# Patient Record
Sex: Female | Born: 1960
Health system: Southern US, Community
[De-identification: ages and names within clinical notes are randomized; demographics above are authoritative.]

## PROBLEM LIST (undated history)

## (undated) DIAGNOSIS — F419 Anxiety disorder, unspecified: Secondary | ICD-10-CM

## (undated) DIAGNOSIS — J45909 Unspecified asthma, uncomplicated: Secondary | ICD-10-CM

## (undated) DIAGNOSIS — I499 Cardiac arrhythmia, unspecified: Secondary | ICD-10-CM

## (undated) DIAGNOSIS — N3010 Interstitial cystitis (chronic) without hematuria: Secondary | ICD-10-CM

## (undated) DIAGNOSIS — J309 Allergic rhinitis, unspecified: Secondary | ICD-10-CM

## (undated) DIAGNOSIS — F329 Major depressive disorder, single episode, unspecified: Secondary | ICD-10-CM

## (undated) DIAGNOSIS — F32A Depression, unspecified: Secondary | ICD-10-CM

## (undated) DIAGNOSIS — M722 Plantar fascial fibromatosis: Secondary | ICD-10-CM

## (undated) DIAGNOSIS — G4700 Insomnia, unspecified: Secondary | ICD-10-CM

## (undated) DIAGNOSIS — R232 Flushing: Secondary | ICD-10-CM

## (undated) DIAGNOSIS — Z8742 Personal history of other diseases of the female genital tract: Secondary | ICD-10-CM

## (undated) DIAGNOSIS — K5190 Ulcerative colitis, unspecified, without complications: Secondary | ICD-10-CM

## (undated) HISTORY — DX: Unspecified asthma, uncomplicated: J45.909

## (undated) HISTORY — DX: Insomnia, unspecified: G47.00

## (undated) HISTORY — PX: RHINOPLASTY: SUR1284

## (undated) HISTORY — DX: Major depressive disorder, single episode, unspecified: F32.9

## (undated) HISTORY — DX: Anxiety disorder, unspecified: F41.9

## (undated) HISTORY — DX: Depression, unspecified: F32.A

## (undated) HISTORY — DX: Cardiac arrhythmia, unspecified: I49.9

## (undated) HISTORY — DX: Flushing: R23.2

## (undated) HISTORY — DX: Interstitial cystitis (chronic) without hematuria: N30.10

## (undated) HISTORY — DX: Plantar fascial fibromatosis: M72.2

## (undated) HISTORY — DX: Allergic rhinitis, unspecified: J30.9

## (undated) HISTORY — DX: Personal history of other diseases of the female genital tract: Z87.42

## (undated) HISTORY — DX: Ulcerative colitis, unspecified, without complications: K51.90

## (undated) HISTORY — PX: DILATION AND CURETTAGE OF UTERUS: SHX78

## (undated) HISTORY — PX: OTHER SURGICAL HISTORY: SHX169

---

## 2014-08-04 DIAGNOSIS — J45909 Unspecified asthma, uncomplicated: Secondary | ICD-10-CM | POA: Insufficient documentation

## 2014-08-23 ENCOUNTER — Other Ambulatory Visit: Payer: Self-pay | Admitting: Gastroenterology

## 2014-12-17 ENCOUNTER — Encounter: Payer: Self-pay | Admitting: Family Medicine

## 2014-12-17 ENCOUNTER — Ambulatory Visit (INDEPENDENT_AMBULATORY_CARE_PROVIDER_SITE_OTHER): Payer: BLUE CROSS/BLUE SHIELD | Admitting: Family Medicine

## 2014-12-17 VITALS — BP 112/80 | HR 84 | Temp 98.1°F | Ht 67.25 in | Wt 145.7 lb

## 2014-12-17 DIAGNOSIS — K51919 Ulcerative colitis, unspecified with unspecified complications: Secondary | ICD-10-CM

## 2014-12-17 DIAGNOSIS — Z7689 Persons encountering health services in other specified circumstances: Secondary | ICD-10-CM

## 2014-12-17 DIAGNOSIS — Z13220 Encounter for screening for lipoid disorders: Secondary | ICD-10-CM

## 2014-12-17 DIAGNOSIS — N951 Menopausal and female climacteric states: Secondary | ICD-10-CM

## 2014-12-17 DIAGNOSIS — Z8742 Personal history of other diseases of the female genital tract: Secondary | ICD-10-CM

## 2014-12-17 DIAGNOSIS — F32A Depression, unspecified: Secondary | ICD-10-CM

## 2014-12-17 DIAGNOSIS — F329 Major depressive disorder, single episode, unspecified: Secondary | ICD-10-CM

## 2014-12-17 DIAGNOSIS — F418 Other specified anxiety disorders: Secondary | ICD-10-CM

## 2014-12-17 DIAGNOSIS — F419 Anxiety disorder, unspecified: Secondary | ICD-10-CM

## 2014-12-17 DIAGNOSIS — Z131 Encounter for screening for diabetes mellitus: Secondary | ICD-10-CM

## 2014-12-17 DIAGNOSIS — Z7189 Other specified counseling: Secondary | ICD-10-CM

## 2014-12-17 DIAGNOSIS — R232 Flushing: Secondary | ICD-10-CM

## 2014-12-17 DIAGNOSIS — K5190 Ulcerative colitis, unspecified, without complications: Secondary | ICD-10-CM | POA: Insufficient documentation

## 2014-12-17 DIAGNOSIS — G4700 Insomnia, unspecified: Secondary | ICD-10-CM

## 2014-12-17 HISTORY — DX: Insomnia, unspecified: G47.00

## 2014-12-17 HISTORY — DX: Ulcerative colitis, unspecified, without complications: K51.90

## 2014-12-17 HISTORY — DX: Personal history of other diseases of the female genital tract: Z87.42

## 2014-12-17 HISTORY — DX: Depression, unspecified: F32.A

## 2014-12-17 HISTORY — DX: Flushing: R23.2

## 2014-12-17 HISTORY — DX: Anxiety disorder, unspecified: F41.9

## 2014-12-17 LAB — LIPID PANEL
Cholesterol: 211 mg/dL — ABNORMAL HIGH (ref 0–200)
HDL: 38.7 mg/dL — ABNORMAL LOW (ref >39.00)
LDL Cholesterol: 153 mg/dL — ABNORMAL HIGH (ref 0–99)
NONHDL: 172.3
Total CHOL/HDL Ratio: 5
Triglycerides: 98 mg/dL (ref 0.0–149.0)
VLDL: 19.6 mg/dL (ref 0.0–40.0)

## 2014-12-17 LAB — HEMOGLOBIN A1C: HEMOGLOBIN A1C: 6 % (ref 4.6–6.5)

## 2014-12-17 NOTE — Progress Notes (Signed)
HPI:  Suzanne Marshall is here to establish care. Used to see Dr. Collins [place]ar [place]. Whole family is transferring to me. Last PCP and physical: Sees Dr. Arelia SneddonMcComb for paps, HRT, breast and female exam and vaginal bleeding.  Has the following chronic problems that require follow up and concerns today:  Perimenopausal Vag bleeding/Hot flashes: -sees Dr. Arelia SneddonMcComb for this -had emb and US -takes divigel and prometrium  Depression/Anxiety/Insomnia: -started 5 years ago after step son committed suicide -on lexapro, not getting counseling, did in the past -spiritual, christian -GAD symptoms more then depression now -no sig issues with sleep in a long time -takes 1/2 [place] once per week for sleep -trying to get regular exercise -has tried melatonin for sleep  URI: -for 1.5 weeks -nasal congestion, PND, cough -denies: sinus pain, SOB, NVD, fevers -taking sudafed  Ulcerative colitis: -Dr. Laural [facility]esJohnson [facility] -not on medicaitons for this anymore -has been fairly stable for a while without medications -getting colonoscopy soon  ROS negative for unless reported above: fevers, unintentional weight loss, hearing or vision loss, chest pain, palpitations, struggling to breath, hemoptysis, melena, hematochezia, hematuria, falls, loc, si, thoughts of self harm  Past Medical History  Diagnosis Date  . Depression   . Asthma   . Arrhythmia   . Insomnia 12/17/2014  . History of irregular menstrual bleeding 12/17/2014    S/p eval with gyn, perimenopausal   . Anxiety and depression 12/17/2014  . Hot flashes 12/17/2014  . Ulcerative colitis 12/17/2014    Managed by Dr. Laural BenesJohnson     Past Surgical History  Procedure Laterality Date  . Rhinoplasty      Family History  Problem Relation Age of Onset  . Heart disease Father [id number]    MI  . Hypertension Father   . Heart disease Maternal Grandmother     History   Social History  . Marital Status: Married    Spouse Name: N/A    Number  of Children: N/A  . Years of Education: N/A   Social History Main Topics  . Smoking status: Former Games developermoker  . Smokeless tobacco: None     Comment: smoke a very little bit in highschool  . Alcohol Use: 0.0 oz/week    0 Not specified per week     Comment: rare, light  . Drug Use: No  . Sexual Activity: None   Other Topics Concern  . 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 , Disp: , Rfl: 11;  escitalopram (LEXAPRO) 20 MG tablet, Take 20 mg by mouth every morning. , Disp: , Rfl: 3;  Multiple Vitamin (MULTIVITAMIN WITH MINERALS) TABS tablet, Take 1 tablet by mouth daily., Disp: , Rfl: ;  progesterone (PROMETRIUM) 100 MG capsule, Take 100 mg by mouth at bedtime., Disp: , Rfl:  acetaminophen (TYLENOL) 500 MG tablet, Take 500-1,000 mg by mouth every 6 (six) hours as needed for mild pain or moderate pain., Disp: , Rfl: ;  cetirizine (ZYRTEC) 10 MG tablet, Take 5 mg by mouth at bedtime., Disp: , Rfl: ;  montelukast (SINGULAIR) 10 MG tablet, Take 10 mg by  mouth at bedtime. , Disp: , Rfl: 3 pseudoephedrine-acetaminophen (TYLENOL SINUS) 30-500 MG TABS, Take 1 tablet by mouth every 4 (four) hours as needed (FOR COLD)., Disp: , Rfl: ;  zolpidem (AMBIEN) 10 MG tablet, Take 10 mg by mouth at bedtime as needed for sleep. , Disp: , Rfl: 4  EXAM:  Filed Vitals:   12/17/14 0945  BP: 112/80  Pulse: 84  Temp: 98.1 F (36.7 C)    Body mass index is 22.65 kg/(m^2).  GENERAL: vitals reviewed and listed above, alert, oriented, appears well hydrated and in no acute distress  HEENT: atraumatic, conjunttiva clear, no obvious abnormalities on inspection of external nose and  ears  NECK: no obvious masses on inspection  LUNGS: clear to auscultation bilaterally, no wheezes, rales or rhonchi, good air movement  CV: HRRR, no peripheral edema  MS: moves all extremities without noticeable abnormality  PSYCH: pleasant and cooperative, no obvious depression or anxiety  ASSESSMENT AND PLAN:  Discussed the following assessment and plan:  Hot flashes  Anxiety and depression -cont current tx  Insomnia -advised will rx limited 20 per year ambien and advised 1/2 dose ( ) only for severe sleep issues and advised of risks. Advised other options for sleep  History of irregular menstrual bleeding  Encounter to establish care  Screening for diabetes mellitus - Plan: Hemoglobin A1c  Screening cholesterol level - Plan: Lipid Panel  -We reviewed the PMH, PSH, FH, SH, Meds and Allergies. -We provided refills for any medications we will prescribe as needed. -We addressed current concerns per orders and patient instructions. -We have asked for records for pertinent exams, studies, vaccines and notes from previous providers. -We have advised patient to follow up per instructions below.   -Patient advised to return or notify a doctor immediately if symptoms worsen or persist or new concerns arise.  Patient Instructions  BEFORE YOU LEAVE: -labs -follow up in 3-4 months  -We have ordered labs or studies at this visit. It can take up to 1-2 weeks for results and processing. We will contact you with instructions IF your results are abnormal. Normal results will be released to your [facility]. If you have not heard from [place] or can not find your results in [facility] in 2 weeks please contact our office.  -PLEASE SIGN UP FOR MYCHART TODAY   We recommend the following healthy lifestyle measures: - eat a healthy diet consisting of lots of vegetables, fruits, beans, nuts, seeds, healthy meats such as white chicken and fish and whole grains.  - avoid fried foods, fast food,  processed foods, sodas, red meet and other fattening foods.  - get a least 150 minutes of aerobic exercise per week.   FOR IMPROVED SLEEP AND TO RESET YOUR SLEEP SCHEDULE:  exercise 30 minutes daily   go to bed and wake up at the same time   keep bedroom cool, dark and quiet   reserve bed for sleep - do not read, watch TV, etc in bed   If you toss and turn more then 15-20 minutes get out of bed and list thoughts/do quite activity then go back to bed; repeat as needed; do not worry about when you eventually fall asleep - still get up at the same time and turn on lights and take shower  get counseling   some people find that a half dose of benadryl, melatonin, tylenol pm or unisom on a few nights per week is helpful initially for a few weeks  seek help and treat  any depression or anxiety  prescription strength sleep medications should only be used in severe cases of insomnia if other measures fail and should be used sparingly      Dayanis Bergquist, Dahlia Client R.

## 2014-12-17 NOTE — Patient Instructions (Addendum)
BEFORE YOU LEAVE: -labs -follow up in 3-4 months  -We have ordered labs or studies at this visit. It can take up to 1-2 weeks for results and processing. We will contact you with instructions IF your results are abnormal. Normal results will be released to your [facility]. If you have not heard from us or can not find your results in [facility] in 2 weeks please contact our office.  -PLEASE SIGN UP FOR MYCHART TODAY   We recommend the following healthy lifestyle measures: - eat a healthy diet consisting of lots of vegetables, fruits, beans, nuts, seeds, healthy meats such as white chicken and fish and whole grains.  - avoid fried foods, fast food, processed foods, sodas, red meet and other fattening foods.  - get a least 150 minutes of aerobic exercise per week.   FOR IMPROVED SLEEP AND TO RESET YOUR SLEEP SCHEDULE: []  exercise 30 minutes daily  []  go to bed and wake up at the same time  []  keep bedroom cool, dark and quiet  []  reserve bed for sleep - do not read, watch TV, etc in bed  []  If you toss and turn more then 15-20 minutes get out of bed and list thoughts/do quite activity then go back to bed; repeat as needed; do not worry about when you eventually fall asleep - still get up at the same time and turn on lights and take shower  [] get counseling  []  some people find that a half dose of benadryl, melatonin, tylenol pm or unisom on a few nights per week is helpful initially for a few weeks  [] seek help and treat any depression or anxiety  [] prescription strength sleep medications should only be used in severe cases of insomnia if other measures fail and should be used sparingly

## 2014-12-17 NOTE — Progress Notes (Signed)
Pre visit review using our clinic review tool, if applicable. No additional management support is needed unless otherwise documented below in the visit note. 

## 2014-12-20 ENCOUNTER — Other Ambulatory Visit: Payer: Self-pay | Admitting: Gastroenterology

## 2014-12-20 ENCOUNTER — Encounter (HOSPITAL_COMMUNITY): Payer: Self-pay | Admitting: *Deleted

## 2014-12-28 ENCOUNTER — Ambulatory Visit (HOSPITAL_COMMUNITY): Payer: BLUE CROSS/BLUE SHIELD | Admitting: Anesthesiology

## 2014-12-28 ENCOUNTER — Encounter (HOSPITAL_COMMUNITY)
Admission: RE | Disposition: A | Discharge status: Home / Self Care | Payer: Self-pay | Source: Ambulatory Visit | Attending: Gastroenterology

## 2014-12-28 ENCOUNTER — Encounter (HOSPITAL_COMMUNITY): Payer: Self-pay

## 2014-12-28 ENCOUNTER — Ambulatory Visit (HOSPITAL_COMMUNITY)
Admission: RE | Admit: 2014-12-28 | Discharge: 2014-12-28 | Disposition: A | Discharge status: Home / Self Care | Payer: BLUE CROSS/BLUE SHIELD | Source: Ambulatory Visit | Attending: Gastroenterology | Admitting: Gastroenterology

## 2014-12-28 DIAGNOSIS — J45909 Unspecified asthma, uncomplicated: Secondary | ICD-10-CM | POA: Diagnosis not present

## 2014-12-28 DIAGNOSIS — F329 Major depressive disorder, single episode, unspecified: Secondary | ICD-10-CM | POA: Insufficient documentation

## 2014-12-28 DIAGNOSIS — Z87891 Personal history of nicotine dependence: Secondary | ICD-10-CM | POA: Diagnosis not present

## 2014-12-28 DIAGNOSIS — Z09 Encounter for follow-up examination after completed treatment for conditions other than malignant neoplasm: Secondary | ICD-10-CM | POA: Diagnosis not present

## 2014-12-28 DIAGNOSIS — Z8711 Personal history of peptic ulcer disease: Secondary | ICD-10-CM | POA: Diagnosis not present

## 2014-12-28 HISTORY — PX: COLONOSCOPY WITH PROPOFOL: SHX5780

## 2014-12-28 SURGERY — COLONOSCOPY WITH PROPOFOL
Anesthesia: Monitor Anesthesia Care

## 2014-12-28 MED ORDER — PROPOFOL 10 MG/ML IV EMUL
INTRAVENOUS | Status: DC | PRN
  Administered 2014-12-28: 20 mg via INTRAVENOUS
  Administered 2014-12-28: 40 mg via INTRAVENOUS

## 2014-12-28 MED ORDER — PROPOFOL INFUSION 10 MG/ML OPTIME
INTRAVENOUS | Status: DC | PRN
  Administered 2014-12-28: 100 ug/kg/min via INTRAVENOUS

## 2014-12-28 MED ORDER — PROPOFOL 10 MG/ML IV BOLUS
INTRAVENOUS | Status: AC
  Filled 2014-12-28: qty 20

## 2014-12-28 MED ORDER — LACTATED RINGERS IV SOLN
INTRAVENOUS | Status: DC | PRN
  Administered 2014-12-28: 07:00:00 via INTRAVENOUS

## 2014-12-28 MED ORDER — SODIUM CHLORIDE 0.9 % IV SOLN: INTRAVENOUS | Status: DC

## 2014-12-28 MED ORDER — LACTATED RINGERS IV SOLN: INTRAVENOUS | Status: DC

## 2014-12-28 MED ORDER — LIDOCAINE HCL (CARDIAC) 20 MG/ML IV SOLN
INTRAVENOUS | Status: AC
  Filled 2014-12-28: qty 5

## 2014-12-28 SURGICAL SUPPLY — 22 items

## 2014-12-28 NOTE — H&P (Signed)
  Procedure: Surveillance colonoscopy. Universal ulcerative colitis since age 54  History: The patient is a 54 year old female born 07-15-1961. She was diagnosed with ulcerative colitis at age 89. She has never required surgery. Symptomatically, the patient's ulcerative colitis is in remission. She denies bloody diarrhea. She is scheduled to undergo a surveillance colonoscopy today.  Medication allergies: Sulfa  Past medical history: Chronic ulcerative colitis diagnosed at age 54. Allergic asthma. Interstitial cystitis.  Family history: Negative for colon cancer.  Exam: The patient is alert and lying comfortably on the endoscopy stretcher. Abdomen is soft and nontender to palpation. Lungs are clear to auscultation. Cardiac exam reveals a regular rhythm.  Plan: Proceed with surveillance colonoscopy

## 2014-12-28 NOTE — Op Note (Signed)
Procedure: Surveillance colonoscopy. Ulcerative colitis diagnosed at age 54.  Endoscopist: Danise EdgeMartin Johnson  Premedication: Propofol administered by anesthesia  Procedure: The patient was placed in the left lateral decubitus position. Anal inspection and digital rectal exam were normal. The Pentax pediatric colonoscope was introduced into the rectum and advanced to the cecum. A normal-appearing ileocecal valve was identified. A normal-appearing appendiceal orifice was identified. Colonic preparation for the exam today was good. Withdrawal time was 22 minutes  Rectum. Normal.  Sigmoid colon and descending colon. Normal  Splenic flexure. Normal  Transverse colon. Normal  Hepatic flexure. Normal  Ascending colon. Normal  Cecum and ileocecal valve. Normal  Surveillance mucosal biopsies. 8 random biopsies were performed from the right colon, 8 random biopsies were performed from the transverse colon, 8 random biopsies were performed from the descending colon, 8 random biopsies were performed from the rectosigmoid:  Assessment: Normal colonoscopy. No mucosal scarring, ulceration, or signs of chronic ulcerative colitis.

## 2014-12-28 NOTE — Anesthesia Preprocedure Evaluation (Addendum)
Anesthesia Evaluation  Patient identified by MRN, date of birth, ID band Patient awake    Reviewed: Allergy & Precautions, NPO status , Patient's Chart, lab work & pertinent test results  History of Anesthesia Complications Negative for: history of anesthetic complications  Airway Mallampati: I  TM Distance: >3 FB Neck ROM: Full    Dental  (+) Teeth Intact, Dental Advisory Given   Pulmonary asthma , former smoker,    Pulmonary exam normal       Cardiovascular negative cardio ROS      Neuro/Psych PSYCHIATRIC DISORDERS Depression negative neurological ROS     GI/Hepatic Neg liver ROS, PUD,   Endo/Other  negative endocrine ROS  Renal/GU negative Renal ROS     Musculoskeletal   Abdominal   Peds  Hematology   Anesthesia Other Findings   Reproductive/Obstetrics                            Anesthesia Physical Anesthesia Plan  ASA: II  Anesthesia Plan: MAC   Post-op Pain Management:    Induction: Intravenous  Airway Management Planned: Simple Face Mask  Additional Equipment:   Intra-op Plan:   Post-operative Plan:   Informed Consent: I have reviewed the patients History and Physical, chart, labs and discussed the procedure including the risks, benefits and alternatives for the proposed anesthesia with the patient or authorized representative who has indicated his/her understanding and acceptance.   Dental advisory given  Plan Discussed with:   Anesthesia Plan Comments:         Anesthesia Quick Evaluation

## 2014-12-28 NOTE — Anesthesia Postprocedure Evaluation (Signed)
Anesthesia Post Note  Patient: Suzanne Marshall  Procedure(s) Performed: Procedure(s) (LRB): COLONOSCOPY WITH PROPOFOL (N/A)  Anesthesia type: MAC  Patient location: PACU  Post pain: Pain level controlled  Post assessment: Patient's Cardiovascular Status Stable  Last Vitals:  Filed Vitals:   12/28/14 0830  BP:   Pulse: 70  Temp: 36.6 C  Resp: 15    Post vital signs: Reviewed and stable  Level of consciousness: sedated  Complications: No apparent anesthesia complications

## 2014-12-28 NOTE — Transfer of Care (Signed)
Immediate Anesthesia Transfer of Care Note  Patient: Suzanne Marshall  Procedure(s) Performed: Procedure(s): COLONOSCOPY WITH PROPOFOL (N/A)  Patient Location: PACU  Anesthesia Type:MAC  Level of Consciousness: awake, alert  and oriented  Airway & Oxygen Therapy: Patient Spontanous Breathing and Patient connected to face mask oxygen  Post-op Assessment: Report given to PACU RN and Post -op Vital signs reviewed and stable  Post vital signs: Reviewed and stable  Complications: No apparent anesthesia complications

## 2014-12-29 ENCOUNTER — Encounter (HOSPITAL_COMMUNITY): Payer: Self-pay | Admitting: Gastroenterology

## 2015-07-06 ENCOUNTER — Telehealth: Payer: Self-pay | Admitting: Family Medicine

## 2015-07-06 NOTE — Telephone Encounter (Signed)
Pt would like to know if you will write a letter for her to excuse her from jury duty. Pt is scheduled to appear next wed, aug 3.  Pt states she has several health issues, including Ulcerative colitis,and asthma. Pt also busy w/ her 53 yr old and husband leaving on mission trip next week.  If you will write letter pt can get the court ino to [place].

## 2015-07-06 NOTE — Telephone Encounter (Signed)
Unfortunately, a medical excuse for jury duty requires she is too sick to go. If having active flare of her ulcerative colitis and too sick to go may be able to request excuse from her GI doctor if he determines is medically indicated.

## 2015-07-07 NOTE — Telephone Encounter (Signed)
Patient informed. 

## 2015-08-02 ENCOUNTER — Telehealth: Payer: Self-pay | Admitting: Family Medicine

## 2015-08-02 NOTE — Telephone Encounter (Signed)
Please let her know about female providers but if prefers to see me - ok to schedule in available 30 minute spot only. Thanks.

## 2015-08-02 NOTE — Telephone Encounter (Signed)
Pt would like dr Selena Batten to accept her  Husband as new pt. Can I sch?

## 2015-08-03 NOTE — Telephone Encounter (Signed)
lmom for pt hus to callback

## 2015-08-04 NOTE — Telephone Encounter (Signed)
Pt hus has been sch °

## 2015-12-29 ENCOUNTER — Telehealth: Payer: Self-pay | Admitting: Family Medicine

## 2015-12-29 MED ORDER — MONTELUKAST SODIUM 10 MG PO TABS: 10.0000 mg | ORAL_TABLET | Freq: Every day | ORAL | Status: DC

## 2015-12-29 NOTE — Telephone Encounter (Signed)
sent 

## 2015-12-29 NOTE — Telephone Encounter (Addendum)
Pt has schedule a cpx on 02-22-16 and would like a refill on singulair 10 mg #90 w/refills sent to [facility] [place]

## 2016-01-26 LAB — LIPID PANEL
Cholesterol: 211 mg/dL — AB (ref 0–200)
HDL: 54 mg/dL (ref 35–70)
LDL CALC: 138 mg/dL
TRIGLYCERIDES: 94 mg/dL (ref 40–160)

## 2016-01-31 ENCOUNTER — Encounter: Payer: Self-pay | Admitting: Family Medicine

## 2016-02-22 ENCOUNTER — Ambulatory Visit (INDEPENDENT_AMBULATORY_CARE_PROVIDER_SITE_OTHER): Payer: BLUE CROSS/BLUE SHIELD | Admitting: Family Medicine

## 2016-02-22 ENCOUNTER — Encounter: Payer: Self-pay | Admitting: Family Medicine

## 2016-02-22 ENCOUNTER — Telehealth: Payer: Self-pay | Admitting: *Deleted

## 2016-02-22 VITALS — BP 112/62 | HR 71 | Temp 98.3°F | Ht 67.25 in | Wt 145.5 lb

## 2016-02-22 DIAGNOSIS — K51919 Ulcerative colitis, unspecified with unspecified complications: Secondary | ICD-10-CM

## 2016-02-22 DIAGNOSIS — Z0000 Encounter for general adult medical examination without abnormal findings: Secondary | ICD-10-CM

## 2016-02-22 DIAGNOSIS — F418 Other specified anxiety disorders: Secondary | ICD-10-CM

## 2016-02-22 DIAGNOSIS — G4700 Insomnia, unspecified: Secondary | ICD-10-CM

## 2016-02-22 DIAGNOSIS — J302 Other seasonal allergic rhinitis: Secondary | ICD-10-CM

## 2016-02-22 DIAGNOSIS — F32A Depression, unspecified: Secondary | ICD-10-CM

## 2016-02-22 DIAGNOSIS — F329 Major depressive disorder, single episode, unspecified: Secondary | ICD-10-CM

## 2016-02-22 DIAGNOSIS — F419 Anxiety disorder, unspecified: Secondary | ICD-10-CM

## 2016-02-22 MED ORDER — MONTELUKAST SODIUM 10 MG PO TABS: 10.0000 mg | ORAL_TABLET | Freq: Every day | ORAL | Status: DC

## 2016-02-22 MED ORDER — ESCITALOPRAM OXALATE 20 MG PO TABS: 20.0000 mg | ORAL_TABLET | Freq: Every morning | ORAL | Status: DC

## 2016-02-22 MED ORDER — ZOLPIDEM TARTRATE 10 MG PO TABS: ORAL_TABLET | ORAL | Status: DC

## 2016-02-22 NOTE — Telephone Encounter (Signed)
The printed Rx for Zolpidem was called in to [facility] at [phone] to Suzanne Marshall.

## 2016-02-22 NOTE — Progress Notes (Signed)
Pre visit review using our clinic review tool, if applicable. No additional management support is needed unless otherwise documented below in the visit note. 

## 2016-02-22 NOTE — Progress Notes (Signed)
HPI:  Here for CPE:  -Concerns and/or follow up today:   Perimenopausal Vag bleeding/Hot flashes: -sees Dr. Arelia SneddonMcComb for this -had emb and US -takes divigel and prometrium  Depression/Anxiety/Insomnia: -started 5 years ago after step son committed suicide -on lexapro, requests refill -spiritual, christian -stable -takes 1/4 to1/2 ambien rarely and requests refill - is well of aware of potential serious risks -trying to get regular exercise -has tried melatonin for sleep  Ulcerative colitis: -Dr. Laural [facility]esJohnson [facility] -not on medicaitons for this anymore -has been fairly stable for a while without medications -had colonoscopy in 2-16  -Diet: variety of foods, balance and well rounded  -Exercise: regular exercise  -Taking folic acid, vitamin D or calcium: no  -Diabetes and Dyslipidemia Screening: Done with gyn last month - labs reviewed and discussed. Lifestlye treatment for mild HLD.   -Hx of HTN: no  -Vaccines: UTD  -pap history: does with gyn  -sexual activity: yes, female partner, no new partners  -wants STI testing (Hep C if born [id number]): no, declined today  -FH breast, colon or ovarian ca: see FH Last mammogram: UTD, does breast health with gyn Last colon cancer screening: UTD - does with GI  Did skin check with [place] [facility] recently   -Alcohol, Tobacco, drug use: see social history  Review of Systems - no fevers, unintentional weight loss, vision loss, hearing loss, chest pain, sob, hemoptysis, melena, hematochezia, hematuria, genital discharge, changing or concerning skin lesions, bleeding, bruising, loc, thoughts of self harm or SI  Past Medical History  Diagnosis Date  . Depression   . Asthma   . Insomnia 12/17/2014  . History of irregular menstrual bleeding 12/17/2014    S/p eval with gyn, perimenopausal   . Anxiety and depression 12/17/2014  . Hot flashes 12/17/2014  . Ulcerative colitis (HCC) 12/17/2014    Managed by Dr. Laural BenesJohnson   .  Arrhythmia 9 yrs ago    with pregnancy    Past Surgical History  Procedure Laterality Date  . Rhinoplasty  yrs ago  . Dilation and curettage of uterus  12 yrs ago  . Colonscopy  last done 7 yrs ago    x 4  . Colonoscopy with propofol N/A 12/28/2014    Procedure: COLONOSCOPY WITH PROPOFOL;  Surgeon: Charolett BumpersMartin K Johnson, MD;  Location: [facility];  Service: Endoscopy;  Laterality: N/A;    Family History  Problem Relation Age of Onset  . Heart disease Father [id number]    MI  . Hypertension Father   . Heart disease Maternal Grandmother     Social History   Social History  . Marital Status: Married    Spouse Name: N/A  . Number of Children: N/A  . Years of Education: N/A   Social History Main Topics  . Smoking status: Former Games developermoker  . Smokeless tobacco: Never Used     Comment: smoke a very little bit in highschool  . Alcohol Use: 0.0 oz/week    0 Standard drinks or equivalent per week     Comment: rare, light  . Drug Use: No  . Sexual Activity: Not Asked   Other Topics Concern  . 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  acetaminophen (TYLENOL) 500 MG tablet, Take 500-1,000 mg by mouth every 6 (six) hours as needed for mild pain or moderate pain., Disp: , Rfl:  .  aspirin 81 MG tablet, Take 81 mg by mouth daily., Disp: , Rfl:  .  cetirizine (ZYRTEC) 10 MG tablet, Take 5 mg by mouth at bedtime., Disp: , Rfl:  .  DIVIGEL 0.25 MG/0.25GM GEL, Apply 0.25 mg topically daily. , Disp: , Rfl: 11 .  escitalopram (LEXAPRO) 20 MG tablet, Take 1 tablet (20 mg total) by mouth every morning., Disp: 90 tablet, Rfl: 3 .  montelukast (SINGULAIR) 10 MG tablet, Take 1 tablet (10 mg total) by mouth at bedtime., Disp: 90 tablet,  Rfl: 3 .  Multiple Vitamin (MULTIVITAMIN WITH MINERALS) TABS tablet, Take 1 tablet by mouth daily., Disp: , Rfl:  .  progesterone (PROMETRIUM) 100 MG capsule, Take 100 mg by mouth at bedtime., Disp: , Rfl:  .  pseudoephedrine-acetaminophen (TYLENOL SINUS) 30-500 MG TABS, Take 1 tablet by mouth every 4 (four) hours as needed (FOR COLD)., Disp: , Rfl:  .  zolpidem (AMBIEN) 10 MG tablet, Not more the 0.5 tablets ( ) as needed once before bed for severe sleep disorder., Disp: 30 tablet, Rfl: 0  EXAM:  Filed Vitals:   02/22/16 1008  BP: 112/62  Pulse: 71  Temp: 98.3 F (36.8 C)    GENERAL: vitals reviewed and listed below, alert, oriented, appears well hydrated and in no acute distress  HEENT: head atraumatic, PERRLA, normal appearance of eyes, ears, nose and mouth. moist mucus membranes.  NECK: supple, no masses or lymphadenopathy  LUNGS: clear to auscultation bilaterally, no rales, rhonchi or wheeze  CV: HRRR, no peripheral edema or cyanosis, normal pedal pulses  BREAST: normal appearance - no lesions or discharge, on palpation normal breast tissue without any suspicious masses  ABDOMEN: bowel sounds normal, soft, non tender to palpation, no masses, no rebound or guarding  GU: deferred, does with gyn  SKIN: no rash or abnormal lesions, full exam not done as does with gyn  MS: normal gait, moves all extremities normally  NEURO: CN II-XII grossly intact, normal muscle strength and sensation to light touch on extremities  PSYCH: normal affect, pleasant and cooperative  ASSESSMENT AND PLAN:  Discussed the following assessment and plan:  Visit for preventive health examination  Anxiety and depression -stable, medication refilled  Seasonal allergies -stable, medication refilled  Insomnia -again discussed risks of sleep aid -advised sleep hygiene and other option -refill provided  Ulcerative colitis with complication, unspecified location (HCC) -managed by  gi  -Discussed and advised all [place] preventive services health task force level A and B recommendations for age, sex and risks.  -Advised at least 150 minutes of exercise per week and a healthy diet low in saturated fats and sweets and consisting of fresh fruits and vegetables, lean meats such as fish and white chicken and whole grains.  -labs, studies and vaccines per orders this encounter  No orders of the defined types were placed in this encounter.    Patient advised to return to clinic immediately if symptoms worsen or persist or new concerns.  There are no Patient Instructions on file for this visit.  No Follow-up on file.  Kriste Basque R.

## 2016-04-23 ENCOUNTER — Other Ambulatory Visit: Payer: Self-pay | Admitting: Family Medicine

## 2016-05-10 ENCOUNTER — Ambulatory Visit (INDEPENDENT_AMBULATORY_CARE_PROVIDER_SITE_OTHER): Payer: BLUE CROSS/BLUE SHIELD | Admitting: Family Medicine

## 2016-05-10 ENCOUNTER — Encounter: Payer: Self-pay | Admitting: Family Medicine

## 2016-05-10 VITALS — BP 120/76 | HR 76 | Temp 98.6°F | Ht 67.25 in | Wt 144.9 lb

## 2016-05-10 DIAGNOSIS — J988 Other specified respiratory disorders: Secondary | ICD-10-CM

## 2016-05-10 MED ORDER — ALBUTEROL SULFATE HFA 108 (90 BASE) MCG/ACT IN AERS: 2.0000 | INHALATION_SPRAY | Freq: Four times a day (QID) | RESPIRATORY_TRACT | Status: DC | PRN

## 2016-05-10 MED ORDER — PREDNISONE 20 MG PO TABS: 40.0000 mg | ORAL_TABLET | Freq: Every day | ORAL | Status: DC

## 2016-05-10 MED ORDER — AZITHROMYCIN 250 MG PO TABS: ORAL_TABLET | ORAL | Status: DC

## 2016-05-10 NOTE — Progress Notes (Signed)
Pre visit review using our clinic review tool, if applicable. No additional management support is needed unless otherwise documented below in the visit note. 

## 2016-05-10 NOTE — Patient Instructions (Signed)
Take the prednisone (steroid) and the antibiotic as instructed.  Albuterol as needed per instrustions.  Follow up as needed if symptoms worsen or persist despite treatment.

## 2016-05-10 NOTE — Progress Notes (Signed)
HPI:  Bronchitis: -got VURI about 2 weeks ago with cold symptoms, fever for a few days and cough, then started having some asthma symptoms -symptoms: mild sob, cough, wheezing, sputum is thick and green - has used albuterol almost daily the last week -no chest pain, fevers,vomoiting, diarrhea -meds: zyrtec, singulair, nasacort, albuterol prn - thinks is expired and needs refill -hx mild intermittent asthma and allergies  ROS: See pertinent positives and negatives per HPI.  Past Medical History  Diagnosis Date  . Depression   . Asthma   . Insomnia 12/17/2014  . History of irregular menstrual bleeding 12/17/2014    S/p eval with gyn, perimenopausal   . Anxiety and depression 12/17/2014  . Hot flashes 12/17/2014  . Ulcerative colitis (HCC) 12/17/2014    Managed by Dr. Laural Benes   . Arrhythmia 9 yrs ago    with pregnancy    Past Surgical History  Procedure Laterality Date  . Rhinoplasty  yrs ago  . Dilation and curettage of uterus  12 yrs ago  . Colonscopy  last done 7 yrs ago    x 4  . Colonoscopy with propofol N/A 12/28/2014    Procedure: COLONOSCOPY WITH PROPOFOL;  Surgeon: Charolett Bumpers, MD;  Location: [facility];  Service: Endoscopy;  Laterality: N/A;    Family History  Problem Relation Age of Onset  . Heart disease Father 55    MI  . Hypertension Father   . Heart disease Maternal Grandmother     Social History   Social History  . Marital Status: Married    Spouse Name: N/A  . Number of Children: N/A  . Years of Education: N/A   Social History Main Topics  . Smoking status: Former Games developer  . Smokeless tobacco: Never Used     Comment: smoke a very little bit in highschool  . Alcohol Use: 0.0 oz/week    0 Standard drinks or equivalent per week     Comment: rare, light  . Drug Use: No  . Sexual Activity: Not Asked   Other Topics Concern  . 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  acetaminophen (TYLENOL) 500 MG tablet, Take 500-1,000 mg by mouth every 6 (six) hours as needed for mild pain or moderate pain., Disp: , Rfl:  .  aspirin 81 MG tablet, Take 81 mg by mouth daily., Disp: , Rfl:  .  cetirizine (ZYRTEC) 10 MG tablet, Take 5 mg by mouth at bedtime., Disp: , Rfl:  .  DIVIGEL 0.25 MG/0.25GM GEL, Apply 0.25 mg topically daily. , Disp: , Rfl: 11 .  escitalopram (LEXAPRO) 20 MG tablet, Take 1 tablet (20 mg total) by mouth every morning., Disp: 90 tablet, Rfl: 3 .  montelukast (SINGULAIR) 10 MG tablet, TAKE 1 TABLET(10 MG) BY MOUTH AT BEDTIME, Disp: 30 tablet, Rfl: 3 .  Multiple Vitamin (MULTIVITAMIN WITH MINERALS) TABS tablet, Take 1 tablet by mouth daily., Disp: , Rfl:  .  progesterone (PROMETRIUM) 100 MG capsule, Take 100 mg by mouth at bedtime., Disp: , Rfl:  .  pseudoephedrine-acetaminophen (TYLENOL SINUS) 30-500 MG TABS, Take 1 tablet by mouth every 4 (four) hours  as needed (FOR COLD)., Disp: , Rfl:  .  triamcinolone (NASACORT ALLERGY 24HR) 55 MCG/ACT AERO nasal inhaler, Place 2 sprays into the nose., Disp: , Rfl:  .  zolpidem (AMBIEN) 10 MG tablet, Not more the 0.5 tablets (5mg ) as needed once before bed for severe sleep disorder., Disp: 30 tablet, Rfl: 0 .  albuterol (PROVENTIL HFA;VENTOLIN HFA) 108 (90 Base) MCG/ACT inhaler, Inhale 2 puffs into the lungs every 6 (six) hours as needed for wheezing or shortness of breath., Disp: 1 Inhaler, Rfl: 3 .  azithromycin (ZITHROMAX) 250 MG tablet, 2 tabs day 1, then one tab daily, Disp: 6 tablet, Rfl: 0 .  predniSONE (DELTASONE) 20 MG tablet, Take 2 tablets (40 mg total) by mouth daily with breakfast., Disp: 8 tablet, Rfl: 0  EXAM:  Filed Vitals:   05/10/16 0936  BP: 120/76  Pulse: 76  Temp: 98.6 F (37 C)    Body mass  index is 22.53 kg/(m^2).  GENERAL: vitals reviewed and listed above, alert, oriented, appears well hydrated and in no acute distress  HEENT: atraumatic, conjunttiva clear, no obvious abnormalities on inspection of external nose and ears, normal appearance of ear canals and TMs, clear nasal congestion, mild post oropharyngeal erythema with PND, no tonsillar edema or exudate, no sinus TTP  NECK: no obvious masses on inspection  LUNGS: clear to auscultation bilaterally, no wheezes, rales or rhonchi, good air movement  CV: HRRR, no peripheral edema  MS: moves all extremities without noticeable abnormality  PSYCH: pleasant and cooperative, no obvious depression or anxiety  ASSESSMENT AND PLAN:  Discussed the following assessment and plan:  Respiratory infection  -opted for abx and prednisone given hx and duration of symptoms with worsening -risks with meds and return precuations discussed  -Patient advised to return or notify a doctor immediately if symptoms worsen or persist or new concerns arise.  Patient Instructions  Take the prednisone (steroid) and the antibiotic as instructed.  Albuterol as needed per instrustions.  Follow up as needed if symptoms worsen or persist despite treatment.     Kriste BasqueKIM, Sharvil Hoey R.

## 2016-12-06 ENCOUNTER — Other Ambulatory Visit: Payer: Self-pay | Admitting: Family Medicine

## 2016-12-06 NOTE — Telephone Encounter (Signed)
Printed Rx [facility] to [facility] [place] at [phone].

## 2017-01-21 ENCOUNTER — Ambulatory Visit (INDEPENDENT_AMBULATORY_CARE_PROVIDER_SITE_OTHER): Payer: BLUE CROSS/BLUE SHIELD | Admitting: Family Medicine

## 2017-01-21 ENCOUNTER — Encounter: Payer: Self-pay | Admitting: Family Medicine

## 2017-01-21 VITALS — BP 102/60 | HR 73 | Temp 98.8°F | Ht 67.25 in | Wt 147.4 lb

## 2017-01-21 DIAGNOSIS — J069 Acute upper respiratory infection, unspecified: Secondary | ICD-10-CM

## 2017-01-21 DIAGNOSIS — F418 Other specified anxiety disorders: Secondary | ICD-10-CM | POA: Diagnosis not present

## 2017-01-21 DIAGNOSIS — F329 Major depressive disorder, single episode, unspecified: Secondary | ICD-10-CM

## 2017-01-21 DIAGNOSIS — F32A Depression, unspecified: Secondary | ICD-10-CM

## 2017-01-21 DIAGNOSIS — H6982 Other specified disorders of Eustachian tube, left ear: Secondary | ICD-10-CM

## 2017-01-21 DIAGNOSIS — F419 Anxiety disorder, unspecified: Secondary | ICD-10-CM

## 2017-01-21 MED ORDER — BENZONATATE 100 MG PO CAPS: 100.0000 mg | ORAL_CAPSULE | Freq: Three times a day (TID) | ORAL | 0 refills | Status: DC | PRN

## 2017-01-21 NOTE — Patient Instructions (Signed)
  Afrin nasal spray for 4 days, then stop. Do not use longer then 4 days.  Nasacort or Flonase daily for 1 month.  Albuterol as needed and follow up if any asthma symptoms not responding to albuterol.  Mediterranean diet Www.[email or web address]   We recommend the following healthy lifestyle for LIFE: 1) Small portions.   Tip: eat off of a salad plate instead of a dinner plate.  Tip: if you need more or a snack choose fruits, veggies and/or a handful of nuts or seeds.  2) Eat a healthy clean diet.  * Tip: Avoid (less then 1 serving per week): processed foods, sweets, sweetened drinks, white starches (rice, flour, bread, potatoes, pasta, etc), red meat, fast foods, butter  *Tip: CHOOSE instead   * 5-9 servings per day of fresh or frozen fruits and vegetables (but not corn, potatoes, bananas, canned or dried fruit)   *nuts and seeds, beans   *olives and olive oil   *small portions of lean meats such as fish and white chicken    *small portions of whole grains  3)Get at least 150 minutes of sweaty aerobic exercise per week.  4)Reduce stress - consider counseling, meditation and relaxation to balance other aspects of your life.

## 2017-01-21 NOTE — Progress Notes (Signed)
Pre visit review using our clinic review tool, if applicable. No additional management support is needed unless otherwise documented below in the visit note. 

## 2017-01-21 NOTE — Progress Notes (Signed)
HPI:  URI: -started: 4-5 days ago -symptoms:nasal congestion - clear, PND, cough, L ear popping - has this intermittently for much longer -denies:fever, SOB, NVD, tooth pain -has tried: nothing -sick contacts/travel/risks: no reported flu, strep or tick exposure -Hx of: allergies, asthma - no sig asthma symptoms or increased albuterol use  Wants to know of a healthy plan for life - healthy diet, way of life, etx to control weight and stay healthy and help with anxiety.  ROS: See pertinent positives and negatives per HPI.  Past Medical History:  Diagnosis Date  . Anxiety and depression 12/17/2014  . Arrhythmia 9 yrs ago   with pregnancy  . Asthma   . Depression   . History of irregular menstrual bleeding 12/17/2014   S/p eval with gyn, perimenopausal   . Hot flashes 12/17/2014  . Insomnia 12/17/2014  . Ulcerative colitis (HCC) 12/17/2014   Managed by Dr. Laural Benes     Past Surgical History:  Procedure Laterality Date  . COLONOSCOPY WITH PROPOFOL N/A 12/28/2014   Procedure: COLONOSCOPY WITH PROPOFOL;  Surgeon: Charolett Bumpers, MD;  Location: [facility];  Service: Endoscopy;  Laterality: N/A;  . colonscopy  last done 7 yrs ago   x 4  . DILATION AND CURETTAGE OF UTERUS  12 yrs ago  . RHINOPLASTY  yrs ago    Family History  Problem Relation Age of Onset  . Heart disease Father 94    MI  . Hypertension Father   . Heart disease Maternal Grandmother     Social History   Social History  . Marital status: Married    Spouse name: N/A  . Number of children: N/A  . Years of education: N/A   Social History Main Topics  . Smoking status: Former Games developer  . Smokeless tobacco: Never Used     Comment: smoke a very little bit in highschool  . Alcohol use 0.0 oz/week     Comment: rare, light  . Drug use: No  . Sexual activity: Not Asked   Other Topics Concern  . 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  acetaminophen (TYLENOL) 500 MG tablet, Take 500-1,000 mg by mouth every 6 (six) hours as needed for mild pain or moderate pain., Disp: , Rfl:  .  albuterol (PROVENTIL HFA;VENTOLIN HFA) 108 (90 Base) MCG/ACT inhaler, Inhale 2 puffs into the lungs every 6 (six) hours as needed for wheezing or shortness of breath., Disp: 1 Inhaler, Rfl: 3 .  aspirin 81 MG tablet, Take 81 mg by mouth daily., Disp: , Rfl:  .  azithromycin (ZITHROMAX) 250 MG tablet, 2 tabs day 1, then one tab daily, Disp: 6 tablet, Rfl: 0 .  cetirizine (ZYRTEC) 10 MG tablet, Take 5 mg by mouth at bedtime., Disp: , Rfl:  .  DIVIGEL 0.25 MG/0.25GM GEL, Apply 0.25 mg topically daily. , Disp: , Rfl: 11 .  escitalopram (LEXAPRO) 20 MG tablet, Take 1 tablet (20 mg total) by mouth every morning., Disp: 90 tablet, Rfl: 3 .  montelukast (SINGULAIR) 10 MG tablet, TAKE 1 TABLET(10 MG) BY MOUTH AT BEDTIME, Disp: 30 tablet, Rfl: 3 .  Multiple Vitamin (MULTIVITAMIN WITH MINERALS) TABS tablet, Take 1 tablet by mouth daily., Disp: , Rfl:  .  predniSONE (DELTASONE) 20 MG tablet, Take 2 tablets (40 mg total) by mouth daily with breakfast., Disp: 8 tablet, Rfl: 0 .  progesterone (PROMETRIUM) 100 MG capsule, Take 100 mg by mouth at bedtime., Disp: , Rfl:  .  pseudoephedrine-acetaminophen (TYLENOL SINUS) 30-500 MG TABS, Take 1 tablet by mouth every 4 (four) hours as needed (FOR COLD)., Disp: , Rfl:  .  triamcinolone (NASACORT ALLERGY 24HR) 55 MCG/ACT AERO nasal inhaler, Place 2 sprays into the nose., Disp: , Rfl:  .  zolpidem (AMBIEN) 10 MG tablet, TAKE NO MORE THEN 1/2 TABLET AT BEDTIME AS NEEDED FOR SEVERE SLEEP DISORDER, Disp: 5 tablet, Rfl: 0 .  benzonatate (TESSALON PERLES) 100 MG capsule, Take 1 capsule (100 mg total) by mouth 3 (three) times daily as  needed for cough., Disp: 20 capsule, Rfl: 0  EXAM:  Vitals:   01/21/17 1010  BP: 102/60  Pulse: 73  Temp: 98.8 F (37.1 C)    Body mass index is 22.91 kg/m.  GENERAL: vitals reviewed and listed above, alert, oriented, appears well hydrated and in no acute distress  HEENT: atraumatic, conjunttiva clear, no obvious abnormalities on inspection of external nose and ears, normal appearance of ear canals and TMs, clear nasal congestion, mild post oropharyngeal erythema with PND, no tonsillar edema or exudate, no sinus TTP  NECK: no obvious masses on inspection  LUNGS: clear to auscultation bilaterally, no wheezes, rales or rhonchi, good air movement  CV: HRRR, no peripheral edema  MS: moves all extremities without noticeable abnormality  PSYCH: pleasant and cooperative, no obvious depression or anxiety  ASSESSMENT AND PLAN:  Discussed the following assessment and plan:  Upper respiratory tract infection, unspecified type  Dysfunction of left eustachian tube  Anxiety and depression  -given HPI and exam findings today, a serious infection or illness is unlikely. We discussed potential etiologies, with VURI being most likely, and advised supportive care and monitoring. We discussed treatment side effects, likely course, antibiotic misuse, transmission, and signs of developing a serious illness. Tessalon for cough. -fo ETD advised short course nasal decongestant and longer course AFRIN -med diet advised and website and handout provided -counseling brochure provided and recommended Dr. Jason FilaBray  -of course, we advised to return or notify a doctor immediately if symptoms worsen or persist or new concerns arise.    Patient Instructions   Afrin nasal spray for 4 days, then stop. Do not use longer then 4 days.  Nasacort or Flonase daily for 1 month.  Albuterol as needed and follow up if any asthma symptoms not responding to albuterol.  Mediterranean  diet [email or web address].[email or web address]   We recommend the following healthy lifestyle for LIFE: 1) Small portions.   Tip: eat off of a salad plate instead of a dinner plate.  Tip: if you need more or a snack choose fruits, veggies and/or a handful of nuts or seeds.  2) Eat a healthy clean diet.  * Tip: Avoid (less then 1 serving per week): processed foods, sweets, sweetened drinks, white starches (rice, flour, bread, potatoes, pasta, etc), red meat, fast foods, butter  *Tip: CHOOSE instead   * 5-9 servings per day of fresh or frozen fruits and vegetables (but not corn, potatoes, bananas, canned or dried fruit)   *nuts and seeds, beans   *olives and olive oil   *small portions of lean meats such as fish and white chicken    *  small portions of whole grains  3)Get at least 150 minutes of sweaty aerobic exercise per week.  4)Reduce stress - consider counseling, meditation and relaxation to balance other aspects of your life.       Kriste Basque R., DO

## 2017-02-25 ENCOUNTER — Other Ambulatory Visit: Payer: Self-pay | Admitting: Family Medicine

## 2017-03-04 ENCOUNTER — Telehealth: Payer: Self-pay | Admitting: *Deleted

## 2017-03-04 NOTE — Telephone Encounter (Signed)
The printed Rx for Ambien from 3/20 was called to the pts pharmacy to Lilia (the pharmacist) and I called the pt and left a detailed message with this information at the pts cell number.

## 2017-04-03 ENCOUNTER — Other Ambulatory Visit: Payer: Self-pay | Admitting: Family Medicine

## 2017-04-04 ENCOUNTER — Other Ambulatory Visit: Payer: Self-pay | Admitting: Family Medicine

## 2017-06-28 ENCOUNTER — Other Ambulatory Visit: Payer: Self-pay | Admitting: Family Medicine

## 2017-08-29 ENCOUNTER — Encounter: Payer: Self-pay | Admitting: Family Medicine

## 2017-10-03 ENCOUNTER — Other Ambulatory Visit: Payer: Self-pay | Admitting: Family Medicine

## 2017-11-09 ENCOUNTER — Other Ambulatory Visit: Payer: Self-pay | Admitting: Family Medicine

## 2017-11-10 ENCOUNTER — Other Ambulatory Visit: Payer: Self-pay | Admitting: Family Medicine

## 2017-12-07 ENCOUNTER — Other Ambulatory Visit: Payer: Self-pay | Admitting: Family Medicine

## 2017-12-19 ENCOUNTER — Encounter: Payer: Self-pay | Admitting: Family Medicine

## 2017-12-31 ENCOUNTER — Other Ambulatory Visit: Payer: Self-pay | Admitting: *Deleted

## 2017-12-31 MED ORDER — ZOLPIDEM TARTRATE 10 MG PO TABS: ORAL_TABLET | ORAL | 0 refills | Status: DC

## 2017-12-31 NOTE — Telephone Encounter (Signed)
[facility].

## 2018-01-15 ENCOUNTER — Other Ambulatory Visit: Payer: Self-pay | Admitting: Family Medicine

## 2018-02-17 ENCOUNTER — Other Ambulatory Visit: Payer: Self-pay | Admitting: Family Medicine

## 2018-02-18 ENCOUNTER — Other Ambulatory Visit: Payer: Self-pay | Admitting: Family Medicine

## 2018-03-16 ENCOUNTER — Other Ambulatory Visit: Payer: Self-pay | Admitting: Family Medicine

## 2018-03-24 ENCOUNTER — Encounter: Payer: Self-pay | Admitting: Family Medicine

## 2018-03-24 ENCOUNTER — Ambulatory Visit: Payer: Managed Care, Other (non HMO) | Admitting: Family Medicine

## 2018-03-24 VITALS — BP 98/62 | HR 77 | Temp 98.2°F | Ht 67.25 in | Wt 146.4 lb

## 2018-03-24 DIAGNOSIS — F32A Depression, unspecified: Secondary | ICD-10-CM

## 2018-03-24 DIAGNOSIS — J302 Other seasonal allergic rhinitis: Secondary | ICD-10-CM

## 2018-03-24 DIAGNOSIS — G4700 Insomnia, unspecified: Secondary | ICD-10-CM

## 2018-03-24 DIAGNOSIS — R0683 Snoring: Secondary | ICD-10-CM | POA: Diagnosis not present

## 2018-03-24 DIAGNOSIS — F419 Anxiety disorder, unspecified: Secondary | ICD-10-CM | POA: Diagnosis not present

## 2018-03-24 DIAGNOSIS — W57XXXA Bitten or stung by nonvenomous insect and other nonvenomous arthropods, initial encounter: Secondary | ICD-10-CM | POA: Diagnosis not present

## 2018-03-24 DIAGNOSIS — M79671 Pain in right foot: Secondary | ICD-10-CM

## 2018-03-24 DIAGNOSIS — R400 Somnolence: Secondary | ICD-10-CM | POA: Diagnosis not present

## 2018-03-24 DIAGNOSIS — F329 Major depressive disorder, single episode, unspecified: Secondary | ICD-10-CM

## 2018-03-24 MED ORDER — ESCITALOPRAM OXALATE 10 MG PO TABS: 10.0000 mg | ORAL_TABLET | Freq: Every morning | ORAL | 3 refills | Status: DC

## 2018-03-24 MED ORDER — MONTELUKAST SODIUM 10 MG PO TABS: ORAL_TABLET | ORAL | 0 refills | Status: DC

## 2018-03-24 MED ORDER — ALBUTEROL SULFATE HFA 108 (90 BASE) MCG/ACT IN AERS: 2.0000 | INHALATION_SPRAY | Freq: Four times a day (QID) | RESPIRATORY_TRACT | 3 refills | Status: DC | PRN

## 2018-03-24 MED ORDER — ZOLPIDEM TARTRATE 5 MG PO TABS: ORAL_TABLET | ORAL | 0 refills | Status: DC

## 2018-03-24 MED ORDER — MONTELUKAST SODIUM 10 MG PO TABS: ORAL_TABLET | ORAL | 3 refills | Status: DC

## 2018-03-24 NOTE — Patient Instructions (Signed)
BEFORE YOU LEAVE: -follow up: CPE with labs in 3 months  -We placed referrals for you as discussed regarding the foot and the sleep apnea. It usually takes about 1-2 weeks to process and schedule this referral. If you have not heard from us regarding this appointment in 2 weeks please contact our office.  -if any signs or symptoms of tick borne illness seek care immediately

## 2018-03-24 NOTE — Progress Notes (Signed)
HPI:  Using dictation device. Unfortunately this device frequently misinterprets words/phrases.  Acute visit for: Tick bite: -occurred 1 week ago on neck -large flat tick, < 24 hours, she removed -denies fevers, malaise, flu symptoms, rash, aches, infection at site, etc  Chronic and other issues:  R foot pain: -chronic, nodule of foot  -would like to see specialist  Allergies/mild intermittent asthma: -reports doing well -meds: singulair, zyrtec every other day, INS, alb prn -reports remote eval for asthma with PFTs -requested refill on singulair and albuterol -uses alb rarely  Depression/anxiety/insomnia: -see PHQ9 -doing relatively well with only mild depressive symptoms sometimes -taking 10mg  lexapro, use 5mg  ambien rarely for sleep - aware of risks, requests refills -no severe symptoms  Snoring/daytime somnolence: -chronic -snores so loud no body can sleep with her -does not feel rested when wakes up -has tonsils/adenoids -wants to undergo testing for sleep apnea  ROS: See pertinent positives and negatives per HPI.  Past Medical History:  Diagnosis Date  . Anxiety and depression 12/17/2014  . Arrhythmia 9 yrs ago   with pregnancy  . Asthma   . Depression   . History of irregular menstrual bleeding 12/17/2014   S/p eval with gyn, perimenopausal   . Hot flashes 12/17/2014  . Insomnia 12/17/2014  . Ulcerative colitis (HCC) 12/17/2014   Managed by Dr. Laural BenesJohnson     Past Surgical History:  Procedure Laterality Date  . COLONOSCOPY WITH PROPOFOL N/A 12/28/2014   Procedure: COLONOSCOPY WITH PROPOFOL;  Surgeon: Charolett BumpersMartin K Johnson, MD;  Location: [facility];  Service: Endoscopy;  Laterality: N/A;  . colonscopy  last done 7 yrs ago   x 4  . DILATION AND CURETTAGE OF UTERUS  12 yrs ago  . RHINOPLASTY  yrs ago    Family History  Problem Relation Age of Onset  . Heart disease Father 3062       MI  . Hypertension Father   . Heart disease Maternal Grandmother     SOCIAL  HX: see above   Current Outpatient Medications:  .  acetaminophen (TYLENOL) 500 MG tablet, Take 500-1,000 mg by mouth every 6 (six) hours as needed for mild pain or moderate pain., Disp: , Rfl:  .  albuterol (PROVENTIL HFA;VENTOLIN HFA) 108 (90 Base) MCG/ACT inhaler, Inhale 2 puffs into the lungs every 6 (six) hours as needed for wheezing or shortness of breath., Disp: 1 Inhaler, Rfl: 3 .  aspirin 81 MG tablet, Take 81 mg by mouth daily., Disp: , Rfl:  .  cetirizine (ZYRTEC) 10 MG tablet, Take 5 mg by mouth at bedtime., Disp: , Rfl:  .  DIVIGEL 0.25 MG/0.25GM GEL, Apply 0.25 mg topically daily. , Disp: , Rfl: 11 .  escitalopram (LEXAPRO) 10 MG tablet, Take 1 tablet (10 mg total) by mouth every morning., Disp: 90 tablet, Rfl: 3 .  montelukast (SINGULAIR) 10 MG tablet, TAKE 1 TABLET(10 MG) BY MOUTH AT BEDTIME, Disp: 90 tablet, Rfl: 0 .  Multiple Vitamin (MULTIVITAMIN WITH MINERALS) TABS tablet, Take 1 tablet by mouth daily., Disp: , Rfl:  .  progesterone (PROMETRIUM) 100 MG capsule, Take 100 mg by mouth at bedtime., Disp: , Rfl:  .  pseudoephedrine-acetaminophen (TYLENOL SINUS) 30-500 MG TABS, Take 1 tablet by mouth every 4 (four) hours as needed (FOR COLD)., Disp: , Rfl:  .  triamcinolone (NASACORT ALLERGY 24HR) 55 MCG/ACT AERO nasal inhaler, Place 2 sprays into the nose., Disp: , Rfl:  .  zolpidem (AMBIEN) 5 MG tablet, TAKE NO MORE THEN 1/2 TABLET  EVERY NIGHT AT BEDTIME AS NEEDED FOR SEVERE SLEEP DISORDER, Disp: 30 tablet, Rfl: 0  EXAM:  Vitals:   03/24/18 1025  BP: 98/62  Pulse: 77  Temp: 98.2 F (36.8 C)    Body mass index is 22.76 kg/m.  GENERAL: vitals reviewed and listed above, alert, oriented, appears well hydrated and in no acute distress  HEENT: atraumatic, conjunttiva clear, no obvious abnormalities on inspection of external nose and ears  NECK: no obvious masses on inspection  LUNGS: clear to auscultation bilaterally, no wheezes, rales or rhonchi, good air  movement  CV: HRRR, no peripheral edema  MS: moves all extremities without noticeable abnormality, firm 1-2 cm nodule plantar aspect L foot  PSYCH: pleasant and cooperative, no obvious depression or anxiety  ASSESSMENT AND PLAN:  Discussed the following assessment and plan:  Right foot pain - Plan: Ambulatory referral to Orthopedic Surgery  Tick bite, initial encounter -discussed signs/symptoms tick borne illnesses  Seasonal allergies -refills provided  Anxiety and depression Insomnia, unspecified type -see phq9 -discussed risks with ambien  -refills provided  Snoring - Plan:  Daytime somnolence - Plan: Ambulatory referral to Pulmonology   -Patient advised to return or notify a doctor immediately if symptoms worsen or persist or new concerns arise.  Patient Instructions  BEFORE YOU LEAVE: -follow up: CPE with labs in 3 months  -We placed referrals for you as discussed regarding the foot and the sleep apnea. It usually takes about 1-2 weeks to process and schedule this referral. If you have not heard from [place] regarding this appointment in 2 weeks please contact our office.  -if any signs or symptoms of tick borne illness seek care immediately       Terressa Koyanagi, DO

## 2018-04-07 ENCOUNTER — Ambulatory Visit (INDEPENDENT_AMBULATORY_CARE_PROVIDER_SITE_OTHER): Payer: Managed Care, Other (non HMO) | Admitting: Orthopedic Surgery

## 2018-04-08 ENCOUNTER — Ambulatory Visit (INDEPENDENT_AMBULATORY_CARE_PROVIDER_SITE_OTHER): Payer: Managed Care, Other (non HMO) | Admitting: Orthopedic Surgery

## 2018-04-16 ENCOUNTER — Encounter: Payer: Self-pay | Admitting: Podiatry

## 2018-04-16 ENCOUNTER — Ambulatory Visit: Payer: Managed Care, Other (non HMO) | Admitting: Podiatry

## 2018-04-16 ENCOUNTER — Ambulatory Visit (INDEPENDENT_AMBULATORY_CARE_PROVIDER_SITE_OTHER): Payer: Managed Care, Other (non HMO)

## 2018-04-16 DIAGNOSIS — M722 Plantar fascial fibromatosis: Secondary | ICD-10-CM

## 2018-04-19 NOTE — Progress Notes (Signed)
   HPI: 57 year old female presenting today as a new patient with a chief complaint of a nodule noted to the arch of the right foot that appeared about a year ago. She states the nodule has gotten bigger since onset. She has not done anything to treat the symptoms and there are no modifying factors noted. Patient is here for further evaluation and treatment.    Past Medical History:  Diagnosis Date  . Anxiety and depression 12/17/2014  . Arrhythmia 9 yrs ago   with pregnancy  . Asthma   . Depression   . History of irregular menstrual bleeding 12/17/2014   S/p eval with gyn, perimenopausal   . Hot flashes 12/17/2014  . Insomnia 12/17/2014  . Ulcerative colitis (HCC) 12/17/2014   Managed by Dr. Laural Benes       Physical Exam: General: The patient is alert and oriented x3 in no acute distress.  Dermatology: Skin is warm, dry and supple bilateral lower extremities. Negative for open lesions or macerations.  Vascular: Palpable pedal pulses bilaterally. No edema or erythema noted. Capillary refill within normal limits.  Neurological: Epicritic and protective threshold grossly intact bilaterally.   Musculoskeletal Exam: Palpable nodule noted to the plantar medial longitudinal arch of the right foot. Pain with palpation also noted to the area. Range of motion within normal limits to all pedal and ankle joints bilateral. Muscle strength 5/5 in all groups bilateral.     Assessment: - plantar fibroma right foot   Plan of Care:  - Patient evaluated. - Injection of 0.5 mLs Celestone Soluspan injected into the right arch.  - Recommended good shoe gear.  - Discussed surgery to resect fibroma and all details involved.  - Return to clinic as needed when ready for surgery.     Felecia Shelling, DPM [facility]  Dr. Felecia Shelling, DPM    [address] [place], [place] [address]                Office [phone]  Fax [phone]

## 2018-04-30 ENCOUNTER — Other Ambulatory Visit: Payer: Self-pay | Admitting: Obstetrics and Gynecology

## 2018-04-30 DIAGNOSIS — N63 Unspecified lump in unspecified breast: Secondary | ICD-10-CM

## 2018-05-02 ENCOUNTER — Inpatient Hospital Stay
Admission: RE | Admit: 2018-05-02 | Discharge: 2018-05-02 | Disposition: A | Discharge status: Home / Self Care | Payer: Self-pay | Source: Ambulatory Visit | Attending: Obstetrics and Gynecology | Admitting: Obstetrics and Gynecology

## 2018-05-02 ENCOUNTER — Other Ambulatory Visit: Payer: Self-pay

## 2018-05-09 ENCOUNTER — Other Ambulatory Visit: Payer: Self-pay

## 2018-05-12 ENCOUNTER — Other Ambulatory Visit: Payer: Self-pay

## 2018-05-13 ENCOUNTER — Other Ambulatory Visit: Payer: Self-pay | Admitting: Obstetrics and Gynecology

## 2018-05-13 ENCOUNTER — Ambulatory Visit
Admission: RE | Admit: 2018-05-13 | Discharge: 2018-05-13 | Disposition: A | Discharge status: Home / Self Care | Payer: Managed Care, Other (non HMO) | Source: Ambulatory Visit | Attending: Obstetrics and Gynecology | Admitting: Obstetrics and Gynecology

## 2018-05-13 DIAGNOSIS — N63 Unspecified lump in unspecified breast: Secondary | ICD-10-CM

## 2018-10-13 ENCOUNTER — Other Ambulatory Visit: Payer: Self-pay | Admitting: *Deleted

## 2018-10-13 NOTE — Telephone Encounter (Signed)
Needs appt

## 2018-10-14 NOTE — Telephone Encounter (Signed)
Rx denial called to [facility] as the pt needs an appt.

## 2018-11-13 ENCOUNTER — Other Ambulatory Visit: Payer: Self-pay

## 2018-12-04 ENCOUNTER — Other Ambulatory Visit: Payer: Self-pay | Admitting: Family Medicine

## 2018-12-15 ENCOUNTER — Encounter: Payer: Self-pay | Admitting: Family Medicine

## 2018-12-16 ENCOUNTER — Ambulatory Visit: Payer: Self-pay | Admitting: Family Medicine

## 2018-12-16 ENCOUNTER — Encounter: Payer: Self-pay | Admitting: Family Medicine

## 2018-12-16 ENCOUNTER — Ambulatory Visit: Payer: Managed Care, Other (non HMO) | Admitting: Family Medicine

## 2018-12-16 VITALS — BP 120/70 | HR 69 | Temp 98.4°F | Ht 67.25 in | Wt 148.2 lb

## 2018-12-16 DIAGNOSIS — R300 Dysuria: Secondary | ICD-10-CM

## 2018-12-16 DIAGNOSIS — R404 Transient alteration of awareness: Secondary | ICD-10-CM | POA: Diagnosis not present

## 2018-12-16 DIAGNOSIS — R400 Somnolence: Secondary | ICD-10-CM

## 2018-12-16 DIAGNOSIS — R0683 Snoring: Secondary | ICD-10-CM | POA: Diagnosis not present

## 2018-12-16 DIAGNOSIS — R42 Dizziness and giddiness: Secondary | ICD-10-CM | POA: Diagnosis not present

## 2018-12-16 DIAGNOSIS — F419 Anxiety disorder, unspecified: Secondary | ICD-10-CM

## 2018-12-16 DIAGNOSIS — R319 Hematuria, unspecified: Secondary | ICD-10-CM

## 2018-12-16 DIAGNOSIS — R5382 Chronic fatigue, unspecified: Secondary | ICD-10-CM

## 2018-12-16 DIAGNOSIS — G479 Sleep disorder, unspecified: Secondary | ICD-10-CM

## 2018-12-16 LAB — COMPREHENSIVE METABOLIC PANEL
ALT: 15 U/L (ref 0–35)
AST: 16 U/L (ref 0–37)
Albumin: 4.2 g/dL (ref 3.5–5.2)
Alkaline Phosphatase: 46 U/L (ref 39–117)
BUN: 15 mg/dL (ref 6–23)
CHLORIDE: 103 meq/L (ref 96–112)
CO2: 30 mEq/L (ref 19–32)
Calcium: 9.7 mg/dL (ref 8.4–10.5)
Creatinine, Ser: 0.61 mg/dL (ref 0.40–1.20)
GFR: 107.53 mL/min (ref >60.00)
Glucose, Bld: 93 mg/dL (ref 70–99)
POTASSIUM: 4.8 meq/L (ref 3.5–5.1)
Sodium: 138 mEq/L (ref 135–145)
TOTAL PROTEIN: 6.6 g/dL (ref 6.0–8.3)
Total Bilirubin: 0.3 mg/dL (ref 0.2–1.2)

## 2018-12-16 LAB — HEMOGLOBIN A1C: HEMOGLOBIN A1C: 5.8 % (ref 4.6–6.5)

## 2018-12-16 LAB — URINALYSIS, ROUTINE W REFLEX MICROSCOPIC
Bilirubin Urine: NEGATIVE
Ketones, ur: NEGATIVE
Nitrite: NEGATIVE
Specific Gravity, Urine: 1.025 (ref 1.000–1.030)
Total Protein, Urine: NEGATIVE
URINE GLUCOSE: NEGATIVE
UROBILINOGEN UA: 0.2 (ref 0.0–1.0)
pH: 5.5 (ref 5.0–8.0)

## 2018-12-16 LAB — POC URINALSYSI DIPSTICK (AUTOMATED)
BILIRUBIN UA: NEGATIVE
GLUCOSE UA: NEGATIVE
KETONES UA: NEGATIVE
Leukocytes, UA: NEGATIVE
Nitrite, UA: NEGATIVE
Protein, UA: NEGATIVE
RBC UA: POSITIVE
Urobilinogen, UA: 0.2 E.U./dL
pH, UA: 5 (ref 5.0–8.0)

## 2018-12-16 LAB — VITAMIN D 25 HYDROXY (VIT D DEFICIENCY, FRACTURES): VITD: 43.77 ng/mL (ref 30.00–100.00)

## 2018-12-16 LAB — CBC
HCT: 40.6 % (ref 36.0–46.0)
HEMOGLOBIN: 13.6 g/dL (ref 12.0–15.0)
MCHC: 33.6 g/dL (ref 30.0–36.0)
MCV: 91.6 fl (ref 78.0–100.0)
Platelets: 339 10*3/uL (ref 150.0–400.0)
RBC: 4.44 Mil/uL (ref 3.87–5.11)
RDW: 12.9 % (ref 11.5–15.5)
WBC: 6.2 10*3/uL (ref 4.0–10.5)

## 2018-12-16 LAB — HDL CHOLESTEROL: HDL: 48.6 mg/dL (ref >39.00)

## 2018-12-16 LAB — TSH: TSH: 1.82 u[IU]/mL (ref 0.35–4.50)

## 2018-12-16 LAB — VITAMIN B12: VITAMIN B 12: 429 pg/mL (ref 211–911)

## 2018-12-16 LAB — CHOLESTEROL, TOTAL: Cholesterol: 221 mg/dL — ABNORMAL HIGH (ref 0–200)

## 2018-12-16 NOTE — Telephone Encounter (Signed)
Patient sent mychart message for appointment with C/O dizziness and fatigue / Patient states dizziness started on 12-13-2018.  She is able to function and walk, but sometimes changing positions increases symptoms.  Patient states she has some nausea, and fatigue as well. No other symptoms noted. Patient has had not change in diet or medications.  Patient states she has never been diagnosed with vertigo, but has had these issues in the past.  Offered patient an appointment on 12-18-2018 at 2:45.  Patient is stating she would like to be seen sooner.  Spoke with [employer].  Appointment made and patient verbalized she would "write it on her calendar".  I did say she would receive a call back by EOD today if she can be worked in sooner.  Patient is in agreement. / Information provided about what to do if symptoms worsen.  Patient verbalizes understanding.  Reason for Disposition . [1] MILD dizziness (e.g., vertigo; walking normally) AND [2] has NOT been evaluated by physician for this  Answer Assessment - Initial Assessment Questions 1. DESCRIPTION: "Describe your dizziness."    Comes and goes, lasting several days 2. VERTIGO: "Do you feel like either you or the room is spinning or tilting?"     yes 3. LIGHTHEADED: "Do you feel lightheaded?" (e.g., somewhat faint, woozy, weak upon standing)     Patient does feel lightheaded 4. SEVERITY: "How bad is it?"  "Can you walk?"   Yes, patient is able to walk.  Patient states she can still function 5. ONSET:  "When did the dizziness begin?"   12/13/2018 6. AGGRAVATING FACTORS: "Does anything make it worse?" (e.g., standing, change in head position)     Changing positions  8. RECURRENT SYMPTOM: "Have you had dizziness before?" If so, ask: "When was the last time?" "What happened that time?"     Per patient is has been while, over 6 months ago 9. OTHER SYMPTOMS: "Do you have any other symptoms?" (e.g., headache, weakness, numbness, vomiting, earache)  Patient C/O fatigue, some nausea  Protocols used: DIZZINESS - VERTIGO-A-AH

## 2018-12-16 NOTE — Telephone Encounter (Signed)
Pt would like to know if she can be worked in today. Please advise. Thanks!

## 2018-12-16 NOTE — Patient Instructions (Signed)
BEFORE YOU LEAVE: -urine dip with reflex micro/culture if abnormal -labs -follow up: 1 month  -We placed a referral for you as discussed. It usually takes about 1 week to process and schedule this referral. If you have not heard from [place] or the neurology office regarding this appointment in 1 week please contact our office.  Please see the link below for more information and a treatment for vertigo.  [email or web address]  Please do not drive with vertigo.  I hope you are feeling better soon! Seek care promptly if your symptoms worsen, new concerns arise or you are not improving with treatment. Seek emergency care if any worsening, headache, chest pain, difficulty breathing, heart concerns, you pass out or any severe symptoms.  See Maggie Font for help with anxiety. Please call the number provided.

## 2018-12-16 NOTE — Telephone Encounter (Signed)
I called the pt and scheduled an appt today and she is aware to arrive at 1:15pm for a 1:30pm appt.

## 2018-12-16 NOTE — Progress Notes (Signed)
HPI:  Using dictation device. Unfortunately this device frequently misinterprets words/phrases.  Acute visit for Vertigo: -started 3 days ago -intermittent, breif spells of vertigo triggered by quick positional changes -some mild nausea with this at times -also ? Mild dysuria - urgency -chronic nasal congestion, pnd - unchanged -denies ha, fevers, sob, cp, palpitations, diarrhea, bowel changes, hematochezia, melena, vision changes, weakness, numbness, fall, syncope, speech changes -hx of similar spells in the past -also reports spells at times for a long time, but worse where suddenly will feel out of it - and then will snap back to reality but felt like was going to pass out - has not had syncope  Hx anxiety and poor sleep: -worse with symptoms the last few days -takes lexapro -I have advised against ambien a number of times, but she continues to feel requires rarely for poor sleep -she wants referral for sleep apnea testing - reports chronic poor sleep, snoring, daytime somnolence and reports was supposed to do sleep study in the past but never went  Hx chronic fatigue: -for many years, seems worse this year -anxiety worse PMH anxiety, depression, insomnia, UC (sees GI for management)  ROS: See pertinent positives and negatives per HPI.  Past Medical History:  Diagnosis Date  . Anxiety and depression 12/17/2014  . Arrhythmia 9 yrs ago   with pregnancy  . Asthma   . Depression   . History of irregular menstrual bleeding 12/17/2014   S/p eval with gyn, perimenopausal   . Hot flashes 12/17/2014  . Insomnia 12/17/2014  . Ulcerative colitis (HCC) 12/17/2014   Managed by Dr. Laural Benes     Past Surgical History:  Procedure Laterality Date  . COLONOSCOPY WITH PROPOFOL N/A 12/28/2014   Procedure: COLONOSCOPY WITH PROPOFOL;  Surgeon: Charolett Bumpers, MD;  Location: [facility];  Service: Endoscopy;  Laterality: N/A;  . colonscopy  last done 7 yrs ago   x 4  . DILATION AND CURETTAGE OF  UTERUS  12 yrs ago  . RHINOPLASTY  yrs ago    Family History  Problem Relation Age of Onset  . Heart disease Father 55       MI  . Hypertension Father   . Heart disease Maternal Grandmother     SOCIAL HX: se ehpi   Current Outpatient Medications:  .  acetaminophen (TYLENOL) 500 MG tablet, Take 500-1,000 mg by mouth every 6 (six) hours as needed for mild pain or moderate pain., Disp: , Rfl:  .  albuterol (PROVENTIL HFA;VENTOLIN HFA) 108 (90 Base) MCG/ACT inhaler, Inhale 2 puffs into the lungs every 6 (six) hours as needed for wheezing or shortness of breath., Disp: 1 Inhaler, Rfl: 3 .  aspirin 81 MG tablet, Take 81 mg by mouth daily., Disp: , Rfl:  .  cetirizine (ZYRTEC) 10 MG tablet, Take 5 mg by mouth at bedtime., Disp: , Rfl:  .  Cholecalciferol (VITAMIN D3 PO), Take 25 mcg by mouth daily., Disp: , Rfl:  .  DIVIGEL 0.25 MG/0.25GM GEL, Apply 0.25 mg topically daily. , Disp: , Rfl: 11 .  escitalopram (LEXAPRO) 10 MG tablet, Take 1 tablet (10 mg total) by mouth every morning., Disp: 90 tablet, Rfl: 3 .  MAGNESIUM PO, Take by mouth daily., Disp: , Rfl:  .  montelukast (SINGULAIR) 10 MG tablet, TAKE 1 TABLET(10 MG) BY MOUTH AT BEDTIME, Disp: 30 tablet, Rfl: 0 .  Multiple Vitamin (MULTIVITAMIN WITH MINERALS) TABS tablet, Take 1 tablet by mouth daily., Disp: , Rfl:  .  progesterone (  PROMETRIUM) 100 MG capsule, Take 100 mg by mouth at bedtime., Disp: , Rfl:  .  pseudoephedrine-acetaminophen (TYLENOL SINUS) 30-500 MG TABS, Take 1 tablet by mouth every 4 (four) hours as needed (FOR COLD)., Disp: , Rfl:  .  triamcinolone (NASACORT ALLERGY 24HR) 55 MCG/ACT AERO nasal inhaler, Place 2 sprays into the nose., Disp: , Rfl:   EXAM:  Vitals:   12/16/18 1327  BP: 120/70  Pulse: 69  Temp: 98.4 F (36.9 C)  SpO2: 97%    Body mass index is 23.04 kg/m.  GENERAL: vitals reviewed and listed above, alert, oriented, appears well hydrated and in no acute distress  HEENT: atraumatic, conjunttiva  clear, no obvious abnormalities on inspection of external nose and ears, normal ear canals and TMs  NECK: no obvious masses on inspection, no bruit  LUNGS: clear to auscultation bilaterally, no wheezes, rales or rhonchi, good air movement  CV: HRRR, no peripheral edema  MS: moves all extremities without noticeable abnormality  PSYCH/NEURO: pleasant and cooperative, no obvious depression or anxiety, CN II-XI grossly intact, finger to nose normal, speech and thought processing grossly intact, gait normal, dix hallpike with symptoms to the L  ASSESSMENT AND PLAN:  Discussed the following assessment and plan: More than 50% of over 40 minutes spent in total in caring for this patient was spent face-to-face with the patient, counseling and/or coordinating care.    Vertigo - Plan: CBC, Comprehensive metabolic panel, Hemoglobin A1c, HDL cholesterol, Cholesterol, total, TSH  Altered awareness, transient  Anxiety  Snoring - Plan: Ambulatory referral to Pulmonology  Sleep disorder - Plan: Ambulatory referral to Pulmonology  Daytime somnolence - Plan: Ambulatory referral to Pulmonology  Chronic fatigue - Plan: Vitamin B12, VITAMIN D 25 Hydroxy (Vit-D Deficiency, Fractures)  Dysuria - Plan: POCT Urinalysis Dipstick (Automated), Urinalysis with Reflex Microscopic, Culture, Urine  -we discussed possible serious and likely etiologies, workup and treatment, treatment risks and return precautions for many things going on today. She very well may have BPPV and anxiety causing these symptoms but advised labs per orders and referral to neurology to rule out other, which she agreed to. -after this discussion, Suzanne Marshall opted for consideration of CBT (though initially reluctant) and continuation of lexapro for anxiety. Gave her the number for behavoiral health and recommended Mellody DanceKeith cottle. Recommended follow up if worsening or not improving to discuss other medication options. Advised against ambien and  did not refill in light of recent symptoms. She reports had not taken in 1 week. Referred to pulm for sleep apnea testing. -advised home video for possible bppv with eval with neurology pending and advised could do vestibular rehab as well if she wishes. Advised prompt reeval or emergency room evaluation if any heart or stroke symptoms, worsening or severe symptoms or new concerning symptoms. Discussed at length. -follow up advised in 1 month, but of course, we advised Suzanne Marshall  to return or notify a doctor immediately if symptoms worsen or persist or new concerns arise.   Patient Instructions  BEFORE YOU LEAVE: -urine dip with reflex micro/culture if abnormal -labs -follow up: 1 month  -We placed a referral for you as discussed. It usually takes about 1 week to process and schedule this referral. If you have not heard from us or the neurology office regarding this appointment in 1 week please contact our office.  Please see the link below for more information and a treatment for vertigo.  [email or web address]  Please do not drive with vertigo.  I hope you are feeling  better soon! Seek care promptly if your symptoms worsen, new concerns arise or you are not improving with treatment. Seek emergency care if any worsening, headache, chest pain, difficulty breathing, heart concerns, you pass out or any severe symptoms.  See Maggie FontKeith Cottle for help with anxiety. Please call the number provided.       Terressa KoyanagiHannah R Lenix Kidd, DO

## 2018-12-17 LAB — URINE CULTURE
MICRO NUMBER: 21949
Result:: NO GROWTH
SPECIMEN QUALITY:: ADEQUATE

## 2018-12-17 MED ORDER — NITROFURANTOIN MONOHYD MACRO 100 MG PO CAPS: 100.0000 mg | ORAL_CAPSULE | Freq: Two times a day (BID) | ORAL | 0 refills | Status: DC

## 2018-12-17 NOTE — Addendum Note (Signed)
Addended by: Johnella Moloney on: 12/17/2018 12:04 PM   Modules accepted: Orders

## 2018-12-18 ENCOUNTER — Ambulatory Visit: Payer: Self-pay | Admitting: Family Medicine

## 2018-12-18 NOTE — Addendum Note (Signed)
Addended by: Johnella Moloney on: 12/18/2018 05:32 PM   Modules accepted: Orders

## 2019-01-19 ENCOUNTER — Ambulatory Visit (INDEPENDENT_AMBULATORY_CARE_PROVIDER_SITE_OTHER): Payer: Managed Care, Other (non HMO) | Admitting: Family Medicine

## 2019-01-19 ENCOUNTER — Encounter: Payer: Self-pay | Admitting: Neurology

## 2019-01-19 ENCOUNTER — Encounter: Payer: Self-pay | Admitting: Family Medicine

## 2019-01-19 VITALS — BP 120/78 | HR 71 | Temp 98.5°F | Ht 67.25 in | Wt 147.9 lb

## 2019-01-19 DIAGNOSIS — F419 Anxiety disorder, unspecified: Secondary | ICD-10-CM | POA: Diagnosis not present

## 2019-01-19 DIAGNOSIS — R319 Hematuria, unspecified: Secondary | ICD-10-CM | POA: Diagnosis not present

## 2019-01-19 DIAGNOSIS — R519 Headache, unspecified: Secondary | ICD-10-CM

## 2019-01-19 DIAGNOSIS — R51 Headache: Secondary | ICD-10-CM

## 2019-01-19 DIAGNOSIS — G4700 Insomnia, unspecified: Secondary | ICD-10-CM | POA: Diagnosis not present

## 2019-01-19 DIAGNOSIS — R4189 Other symptoms and signs involving cognitive functions and awareness: Secondary | ICD-10-CM

## 2019-01-19 DIAGNOSIS — F32A Depression, unspecified: Secondary | ICD-10-CM

## 2019-01-19 DIAGNOSIS — K51919 Ulcerative colitis, unspecified with unspecified complications: Secondary | ICD-10-CM

## 2019-01-19 DIAGNOSIS — F329 Major depressive disorder, single episode, unspecified: Secondary | ICD-10-CM

## 2019-01-19 LAB — URINALYSIS, ROUTINE W REFLEX MICROSCOPIC
Bilirubin Urine: NEGATIVE
Ketones, ur: NEGATIVE
NITRITE: NEGATIVE
Specific Gravity, Urine: 1.025 (ref 1.000–1.030)
TOTAL PROTEIN, URINE-UPE24: NEGATIVE
URINE GLUCOSE: NEGATIVE
Urobilinogen, UA: 0.2 (ref 0.0–1.0)
pH: 5 (ref 5.0–8.0)

## 2019-01-19 LAB — POCT URINALYSIS DIPSTICK
Bilirubin, UA: NEGATIVE
GLUCOSE UA: NEGATIVE
Ketones, UA: NEGATIVE
Nitrite, UA: NEGATIVE
Protein, UA: NEGATIVE
Spec Grav, UA: 1.015 (ref 1.010–1.025)
Urobilinogen, UA: 0.2 E.U./dL
pH, UA: 7.5 (ref 5.0–8.0)

## 2019-01-19 NOTE — Patient Instructions (Addendum)
BEFORE YOU LEAVE: -I am having my referral coordinator check on the referrals -urine dip with reflex micro if abnormal -follow up: 3 months  Call today to set up visit with Maggie Font for CBT for insomnia.  Please call in 1-2 weeks if you do not have the information for the following referrals: -neurology for headaches and the cognitive fog -GI for Ulcerative colitis -pulmonology for the sleep apnea  See the urologist as planned.

## 2019-01-19 NOTE — Addendum Note (Signed)
Addended by: Johnella Moloney on: 01/19/2019 11:55 AM   Modules accepted: Orders

## 2019-01-19 NOTE — Progress Notes (Signed)
HPI:  Using dictation device. Unfortunately this device frequently misinterprets words/phrases.  Suzanne Marshall is a pleasant 58 year old with longstanding anxiety, depression and insomnia here for follow-up of several issues.  She presented last month with vertigo, dysuria, nasal congestion, longstanding anxiety/insomnia/depression.  Longstanding fatigue.  We did extensive testing on the urine and labs.  However to get his symptoms had some classic features of BPPV, home exercises were provided.  Also referred her to neurology and psychology for further evaluation and management.  She also wanted testing for sleep apnea, referral was placed.  We advised against the use of Ambien given her symptoms. Her labs were fairly unremarkable.  She did have some blood in the urine and for this we referred her to urology. Today she reports doing better. Vertigo resolved. Still has longstanding brief electrical like shock headaches from time to time with feeling "out of it." has dysruia intermittently and reports history IC. Feels better the last few days after treatment of yeast infection with her gynecologist. Wants to recheck urine today. Has appointment with urology next month.  Has not done CBT. Still with same sleep issues. Has not heard from pulm office about the testing for OSA. Has abd pain from time to time - thinks had bad episode of abd pain in the middle of the night prior to last visit. None recently. Has Hx of UC, but current gastroenterologist retired (Dr. Laural Benes.) Agrees to referral to new gastroenterologist. Unfortunately, on review of chart today, it appears that the neurology referral did not process. ROS: See pertinent positives and negatives per HPI.  Past Medical History:  Diagnosis Date  . Anxiety and depression 12/17/2014  . Arrhythmia 9 yrs ago   with pregnancy  . Asthma   . Depression   . History of irregular menstrual bleeding 12/17/2014   S/p eval with gyn, perimenopausal   .  Hot flashes 12/17/2014  . Insomnia 12/17/2014  . Ulcerative colitis (HCC) 12/17/2014   Managed by Dr. Laural Benes     Past Surgical History:  Procedure Laterality Date  . COLONOSCOPY WITH PROPOFOL N/A 12/28/2014   Procedure: COLONOSCOPY WITH PROPOFOL;  Surgeon: Charolett Bumpers, MD;  Location: [facility];  Service: Endoscopy;  Laterality: N/A;  . colonscopy  last done 7 yrs ago   x 4  . DILATION AND CURETTAGE OF UTERUS  12 yrs ago  . RHINOPLASTY  yrs ago    Family History  Problem Relation Age of Onset  . Heart disease Father 45       MI  . Hypertension Father   . Heart disease Maternal Grandmother     SOCIAL HX: see hpi   Current Outpatient Medications:  .  acetaminophen (TYLENOL) 500 MG tablet, Take 500-1,000 mg by mouth every 6 (six) hours as needed for mild pain or moderate pain., Disp: , Rfl:  .  albuterol (PROVENTIL HFA;VENTOLIN HFA) 108 (90 Base) MCG/ACT inhaler, Inhale 2 puffs into the lungs every 6 (six) hours as needed for wheezing or shortness of breath., Disp: 1 Inhaler, Rfl: 3 .  aspirin 81 MG tablet, Take 81 mg by mouth daily., Disp: , Rfl:  .  cetirizine (ZYRTEC) 10 MG tablet, Take 5 mg by mouth at bedtime., Disp: , Rfl:  .  Cholecalciferol (VITAMIN D3 PO), Take 25 mcg by mouth daily., Disp: , Rfl:  .  DIVIGEL 0.25 MG/0.25GM GEL, Apply 0.25 mg topically daily. , Disp: , Rfl: 11 .  escitalopram (LEXAPRO) 10 MG tablet, Take 1 tablet (10 mg total)  by mouth every morning., Disp: 90 tablet, Rfl: 3 .  MAGNESIUM PO, Take by mouth daily., Disp: , Rfl:  .  montelukast (SINGULAIR) 10 MG tablet, TAKE 1 TABLET(10 MG) BY MOUTH AT BEDTIME, Disp: 30 tablet, Rfl: 0 .  Multiple Vitamin (MULTIVITAMIN WITH MINERALS) TABS tablet, Take 1 tablet by mouth daily., Disp: , Rfl:  .  progesterone (PROMETRIUM) 100 MG capsule, Take 100 mg by mouth at bedtime., Disp: , Rfl:  .  pseudoephedrine-acetaminophen (TYLENOL SINUS) 30-500 MG TABS, Take 1 tablet by mouth every 4 (four) hours as needed (FOR  COLD)., Disp: , Rfl:  .  triamcinolone (NASACORT ALLERGY 24HR) 55 MCG/ACT AERO nasal inhaler, Place 2 sprays into the nose., Disp: , Rfl:   EXAM:  Vitals:   01/19/19 1042  BP: 120/78  Pulse: 71  Temp: 98.5 F (36.9 C)    Body mass index is 22.99 kg/m.  GENERAL: vitals reviewed and listed above, alert, oriented, appears well hydrated and in no acute distress  HEENT: atraumatic, conjunttiva clear, no obvious abnormalities on inspection of external nose and ears  NECK: no obvious masses on inspection  LUNGS: clear to auscultation bilaterally, no wheezes, rales or rhonchi, good air movement  CV: HRRR, no peripheral edema  MS: moves all extremities without noticeable abnormality  PSYCH: pleasant and cooperative, no obvious depression or anxiety  ASSESSMENT AND PLAN:  Discussed the following assessment and plan:  Persistent headaches - Plan: Ambulatory referral to Neurology  Cognitive changes - Plan: Ambulatory referral to Neurology  Insomnia, unspecified type  Anxiety and depression  Ulcerative colitis with complication, unspecified location [facility](HCC) - Plan: Ambulatory referral to Gastroenterology  Hematuria, unspecified type - Plan: POC Urinalysis Dipstick  -advised referral coordinator to check on referrals - re-ordered neurology referral -advised to start CBT for insomnia/anxiety -she has hx of UC and notified me her prior GI doc has retired, referral placed, currently no symptoms but occ abd pain - including about 1 month ago -seeing urology and appt scheduled - checking urine today per her request to confirm the blood, currently no symptoms -Patient advised to return or notify a doctor immediately if symptoms worsen or persist or new concerns arise.  Patient Instructions  BEFORE YOU LEAVE: -I am having my referral coordinator check on the referrals -urine dip with reflex micro if abnormal -follow up: 3 months  Call today to set up visit with Maggie FontKeith Cottle for CBT  for insomnia.  Please call in 1-2 weeks if you do not have the information for the following referrals: -neurology for headaches and the cognitive fog -GI for Ulcerative colitis -pulmonology for the sleep apnea  See the urologist as planned.    Terressa KoyanagiHannah R Copelyn Widmer, DO

## 2019-01-20 ENCOUNTER — Encounter: Payer: Self-pay | Admitting: Family Medicine

## 2019-01-20 LAB — URINE CULTURE
MICRO NUMBER:: 173134
Result:: NO GROWTH
SPECIMEN QUALITY: ADEQUATE

## 2019-01-21 ENCOUNTER — Encounter: Payer: Self-pay | Admitting: Family Medicine

## 2019-01-21 NOTE — Telephone Encounter (Signed)
See prior note

## 2019-01-30 ENCOUNTER — Encounter: Payer: Self-pay | Admitting: Pulmonary Disease

## 2019-01-30 ENCOUNTER — Ambulatory Visit (INDEPENDENT_AMBULATORY_CARE_PROVIDER_SITE_OTHER): Payer: Managed Care, Other (non HMO) | Admitting: Pulmonary Disease

## 2019-01-30 VITALS — BP 124/76 | HR 73 | Ht 67.25 in | Wt 149.0 lb

## 2019-01-30 DIAGNOSIS — R0683 Snoring: Secondary | ICD-10-CM | POA: Insufficient documentation

## 2019-01-30 DIAGNOSIS — G4733 Obstructive sleep apnea (adult) (pediatric): Secondary | ICD-10-CM | POA: Insufficient documentation

## 2019-01-30 DIAGNOSIS — J4520 Mild intermittent asthma, uncomplicated: Secondary | ICD-10-CM | POA: Diagnosis not present

## 2019-01-30 NOTE — Assessment & Plan Note (Signed)
Given excessive daytime somnolence, narrow pharyngeal exam, non-refreshing sleep & loud snoring, obstructive sleep apnea is very likely & an overnight polysomnogram will be scheduled as a home study. The pathophysiology of obstructive sleep apnea , it's cardiovascular consequences & modes of treatment including CPAP were discused with the patient in detail & they evidenced understanding.  Pretest probability is intermediate.

## 2019-01-30 NOTE — Patient Instructions (Signed)
Schedule home sleep study.   

## 2019-01-30 NOTE — Assessment & Plan Note (Signed)
Continue Singulair daily and albuterol MDI as needed for now

## 2019-01-30 NOTE — Progress Notes (Signed)
Subjective:    Patient ID: Suzanne Marshall, female    DOB: [birth year], 58 y.o.   MRN: [id number]  HPI  Chief Complaint  Patient presents with  . Consult    snoring for years - doesn't feel rested after sleeping.      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 Loud snoring has been noted by family members for many years.  She reports that her father also snored and was diagnosed with OSA and her brother also snores similarly. Husband sometimes has to sleep in a different room. Lately she also reports non-refreshing sleep and wakes up feeling tired and sometimes dizzy.  She takes Ambien about once a week. Epworth sleepiness score is 5 and she reports sleepiness as a passenger in a car or lying down to rest in the afternoons. Bedtime is between 10 and 11 PM, sleep latency is about 30 minutes, she reads on her iPad around bedtime, sleeps on her right side with one pillow, reports 2-3 nocturnal awakenings including nocturia, sometimes she will wake up around 3 AM and have a prolonged awakening for about 30 minutes but almost never gets out of bed, she is out of bed around 7:15 AM feeling tired with dryness of mouth but denies a headache.  She will nap around 2 PM on the couch for anywhere from 30 to 90 minutes, naps are less refreshing then what they used to be. There is no history suggestive of cataplexy, sleep paralysis or parasomnias  Weight is mostly unchanged. She has an active lifestyle and walks with her dog for almost 60 minutes daily and used to play tennis.  Past medical history of ulcerative colitis in remission not on meds, interstitial cystitis. Asthma, mild intermittent for about 20 years, triggered by seasonal allergies especially in the fall, has been maintained on Singulair and albuterol as needed occasionally needs a steroid inhaler, no flareup for the last 2 years   Past Medical History:  Diagnosis Date  . Anxiety and depression  12/17/2014  . Arrhythmia 9 yrs ago   with pregnancy  . Asthma   . Cystitides, interstitial, chronic   . Depression   . History of irregular menstrual bleeding 12/17/2014   S/p eval with gyn, perimenopausal   . Hot flashes 12/17/2014  . Insomnia 12/17/2014  . Ulcerative colitis (HCC) 12/17/2014   Managed by Dr. Laural Benes     Past Surgical History:  Procedure Laterality Date  . COLONOSCOPY WITH PROPOFOL N/A 12/28/2014   Procedure: COLONOSCOPY WITH PROPOFOL;  Surgeon: Charolett Bumpers, MD;  Location: [facility];  Service: Endoscopy;  Laterality: N/A;  . colonscopy  last done 7 yrs ago   x 4  . DILATION AND CURETTAGE OF UTERUS  12 yrs ago  . RHINOPLASTY  yrs ago    Allergies  Allergen Reactions  . Ibuprofen Other (See Comments)    GI upset per patient  . Sulfa Antibiotics Rash    And high fever    Social History   Socioeconomic History  . Marital status: Married    Spouse name: Not on file  . Number of children: Not on file  . Years of education: Not on file  . Highest education level: Not on file  Occupational History  . Not on file  Social Needs  . Financial resource strain: Not on file  . Food insecurity:    Worry: Not on file    Inability: Not on file  . Transportation needs:    Medical:  Not on file    Non-medical: Not on file  Tobacco Use  . Smoking status: Former Games developer  . Smokeless tobacco: Never Used  . Tobacco comment: smoked a very little bit in highschool but she does not consider herself a regular smoker and hasn't smoked since  Substance and Sexual Activity  . Alcohol use: Yes    Alcohol/week: 0.0 standard drinks    Comment: rare, light  . Drug use: No  . Sexual activity: Not on file  Lifestyle  . Physical activity:    Days per week: Not on file    Minutes per session: Not on file  . Stress: Not on file  Relationships  . Social connections:    Talks on phone: Not on file    Gets together: Not on file    Attends religious service: Not on file     Active member of club or organization: Not on file    Attends meetings of clubs or organizations: Not on file    Relationship status: Not on file  . Intimate partner violence:    Fear of current or ex partner: Not on file    Emotionally abused: Not on file    Physically abused: Not on file    Forced sexual activity: Not on file  Other Topics Concern  . 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 Heart disease Father 57       MI  . Hypertension Father   . Heart disease Maternal Grandmother   . Hypertension Mother     Review of Systems  Constitutional: Negative for fever and unexpected weight change.  HENT: Positive for congestion, postnasal drip, sinus pressure, sneezing and sore throat. Negative for dental problem, ear pain, nosebleeds, rhinorrhea and trouble swallowing.   Eyes: Positive for itching. Negative for redness.  Respiratory: Positive for cough, chest tightness, shortness of breath and wheezing.   Cardiovascular: Negative for palpitations and leg swelling.  Gastrointestinal: Negative for nausea and vomiting.  Genitourinary: Positive for dysuria.  Musculoskeletal: Negative for joint swelling.  Skin: Negative for rash.  Allergic/Immunologic: Positive for environmental allergies. Negative for food allergies and immunocompromised state.  Neurological: Negative for headaches.  Hematological: Does not bruise/bleed easily.  Psychiatric/Behavioral: Positive for dysphoric mood. The patient is nervous/anxious.        Objective:   Physical Exam  Gen. Pleasant, well-nourished, in no distress, normal affect ENT - no pallor,icterus, no post nasal drip Neck: No JVD, no thyromegaly, no carotid  bruits Lungs: no use of accessory muscles, no dullness to percussion, clear without rales or rhonchi  Cardiovascular: Rhythm regular, heart sounds  normal, no murmurs or gallops, no peripheral edema Abdomen: soft and non-tender, no hepatosplenomegaly, BS normal. Musculoskeletal: No deformities, no cyanosis or clubbing Neuro:  alert, non focal       Assessment & Plan:

## 2019-03-04 ENCOUNTER — Encounter: Payer: Self-pay | Admitting: Family Medicine

## 2019-03-05 ENCOUNTER — Ambulatory Visit (INDEPENDENT_AMBULATORY_CARE_PROVIDER_SITE_OTHER): Payer: Managed Care, Other (non HMO) | Admitting: Family Medicine

## 2019-03-05 ENCOUNTER — Other Ambulatory Visit: Payer: Self-pay

## 2019-03-05 ENCOUNTER — Encounter: Payer: Self-pay | Admitting: Family Medicine

## 2019-03-05 DIAGNOSIS — J4520 Mild intermittent asthma, uncomplicated: Secondary | ICD-10-CM

## 2019-03-05 DIAGNOSIS — K51919 Ulcerative colitis, unspecified with unspecified complications: Secondary | ICD-10-CM

## 2019-03-05 DIAGNOSIS — J309 Allergic rhinitis, unspecified: Secondary | ICD-10-CM | POA: Diagnosis not present

## 2019-03-05 DIAGNOSIS — G4700 Insomnia, unspecified: Secondary | ICD-10-CM | POA: Diagnosis not present

## 2019-03-05 DIAGNOSIS — F329 Major depressive disorder, single episode, unspecified: Secondary | ICD-10-CM

## 2019-03-05 DIAGNOSIS — F419 Anxiety disorder, unspecified: Secondary | ICD-10-CM | POA: Diagnosis not present

## 2019-03-05 DIAGNOSIS — F32A Depression, unspecified: Secondary | ICD-10-CM

## 2019-03-05 MED ORDER — ZOLPIDEM TARTRATE 5 MG PO TABS: 5.0000 mg | ORAL_TABLET | Freq: Every evening | ORAL | 0 refills | Status: DC | PRN

## 2019-03-05 MED ORDER — MONTELUKAST SODIUM 10 MG PO TABS: ORAL_TABLET | ORAL | 1 refills | Status: DC

## 2019-03-05 NOTE — Progress Notes (Signed)
Virtual Visit via Video Note  I connected with Suzanne Marshall on 03/05/19 at  3:30 PM EDT by a video enabled telemedicine application and verified that I am speaking with the correct person using two identifiers.  Location patient: home Location provider:work or home office Persons participating in the virtual visit: patient, provider  I discussed the limitations of evaluation and management by telemedicine and the availability of in person appointments. The patient expressed understanding and agreed to proceed.   HPI:  Anxiety/Insomnia: -has had increased stress with the coronavirus, germaphobe -she uses ambien rarely for sleep, tolerates well, understands risks, requests refill -not doing counseling currently but agrees to consider -continues lexapro -no severe symptoms, SOB, thoughts of harm reported  Ulcerative Colitis -needs gi appointment -apparently we referred to two different providers and both declined the referral, I was not aware of that until today -no symptoms, but she has hx UC and her prior GI doc retired  Asthma/Allergies: -needs refill on singulair -reports fairly stable   ROS: See pertinent positives and negatives per HPI.  Past Medical History:  Diagnosis Date  . Anxiety and depression 12/17/2014  . Arrhythmia 9 yrs ago   with pregnancy  . Asthma   . Cystitides, interstitial, chronic   . Depression   . History of irregular menstrual bleeding 12/17/2014   S/p eval with gyn, perimenopausal   . Hot flashes 12/17/2014  . Insomnia 12/17/2014  . Ulcerative colitis (HCC) 12/17/2014   Managed by Dr. Laural Benes     Past Surgical History:  Procedure Laterality Date  . COLONOSCOPY WITH PROPOFOL N/A 12/28/2014   Procedure: COLONOSCOPY WITH PROPOFOL;  Surgeon: Charolett Bumpers, MD;  Location: [facility];  Service: Endoscopy;  Laterality: N/A;  . colonscopy  last done 7 yrs ago   x 4  . DILATION AND CURETTAGE OF UTERUS  12 yrs ago  . RHINOPLASTY  yrs ago    Family History   Problem Relation Age of Onset  . Heart disease Father 12       MI  . Hypertension Father   . Heart disease Maternal Grandmother   . Hypertension Mother     SOCIAL HX: see hpi   Current Outpatient Medications:  .  acetaminophen (TYLENOL) 500 MG tablet, Take 500-1,000 mg by mouth every 6 (six) hours as needed for mild pain or moderate pain., Disp: , Rfl:  .  albuterol (PROVENTIL HFA;VENTOLIN HFA) 108 (90 Base) MCG/ACT inhaler, Inhale 2 puffs into the lungs every 6 (six) hours as needed for wheezing or shortness of breath., Disp: 1 Inhaler, Rfl: 3 .  aspirin 81 MG tablet, Take 81 mg by mouth daily., Disp: , Rfl:  .  cetirizine (ZYRTEC) 10 MG tablet, Take 5 mg by mouth at bedtime., Disp: , Rfl:  .  Cholecalciferol (VITAMIN D3 PO), Take 25 mcg by mouth daily., Disp: , Rfl:  .  DIVIGEL 0.25 MG/0.25GM GEL, Apply 0.25 mg topically daily. , Disp: , Rfl: 11 .  escitalopram (LEXAPRO) 10 MG tablet, Take 1 tablet (10 mg total) by mouth every morning., Disp: 90 tablet, Rfl: 3 .  MAGNESIUM PO, Take by mouth daily., Disp: , Rfl:  .  montelukast (SINGULAIR) 10 MG tablet, One tablet daily, Disp: 90 tablet, Rfl: 1 .  Multiple Vitamin (MULTIVITAMIN WITH MINERALS) TABS tablet, Take 1 tablet by mouth daily., Disp: , Rfl:  .  progesterone (PROMETRIUM) 100 MG capsule, Take 100 mg by mouth at bedtime., Disp: , Rfl:  .  pseudoephedrine-acetaminophen (TYLENOL SINUS) 30-500 MG  TABS, Take 1 tablet by mouth every 4 (four) hours as needed (FOR COLD)., Disp: , Rfl:  .  triamcinolone (NASACORT ALLERGY 24HR) 55 MCG/ACT AERO nasal inhaler, Place 2 sprays into the nose., Disp: , Rfl:  .  zolpidem (AMBIEN) 5 MG tablet, Take 1 tablet (5 mg total) by mouth at bedtime as needed for sleep (about once a week for sleep)., Disp: 30 tablet, Rfl: 0  EXAM:  VITALS per patient if applicable:  GENERAL: alert, oriented, appears well and in no acute distress  HEENT: atraumatic, conjunttiva clear, no obvious abnormalities on  inspection of external nose and ears  NECK: normal movements of the head and neck  LUNGS: on inspection no signs of respiratory distress, breathing rate appears normal, no obvious gross SOB, gasping or wheezing  CV: no obvious cyanosis  MS: moves all visible extremities without noticeable abnormality  PSYCH/NEURO: pleasant and cooperative, no obvious depression or anxiety, speech and thought processing grossly intact  ASSESSMENT AND PLAN:  Discussed the following assessment and plan:  Anxiety and depression  Insomnia, unspecified type  Ulcerative colitis with complication, unspecified location (HCC)  Allergic rhinitis, unspecified seasonality, unspecified trigger  Mild intermittent asthma without complication   Refilled meds- discussed risks CBT advised and made her aware of virtual option with [facility] given COVID19 ituation Advised assistant to check on and re-refer for GI and apologized to patient TOC visit with Dr. Hassan Rowan in 3 months  I discussed the assessment and treatment plan with the patient. The patient was provided an opportunity to ask questions and all were answered. The patient agreed with the plan and demonstrated an understanding of the instructions.   The patient was advised to call back or seek an in-person evaluation if the symptoms worsen or if the condition fails to improve as anticipated.   Follow up instructions: Advised assistant Ronnald Collum to help patient arrange the following: -TOC visit with Dr. Hassan Rowan in 3 months (virtual) -please check on GI referral, declined by Dr. Leone Payor and Dr. Loreta Ave - please schedule with another GI doc, thanks     Terressa Koyanagi, DO

## 2019-03-05 NOTE — Telephone Encounter (Signed)
I called the pt and scheduled a webex appt for today at 3:30pm.

## 2019-03-09 ENCOUNTER — Telehealth: Payer: Self-pay | Admitting: *Deleted

## 2019-03-09 DIAGNOSIS — K51919 Ulcerative colitis, unspecified with unspecified complications: Secondary | ICD-10-CM

## 2019-03-09 NOTE — Telephone Encounter (Signed)
Per Dr Selena Batten I left a detailed message at the pts home number to call the office to schedule a transfer of care with Dr Hassan Rowan in 3 months.  Also left a message stating a new referral was entered for GI and someone will call with appt info.

## 2019-04-02 ENCOUNTER — Other Ambulatory Visit: Payer: Self-pay | Admitting: Family Medicine

## 2019-04-02 ENCOUNTER — Ambulatory Visit: Payer: Self-pay | Admitting: Neurology

## 2019-05-01 ENCOUNTER — Other Ambulatory Visit: Payer: Self-pay | Admitting: Family Medicine

## 2019-05-07 ENCOUNTER — Telehealth: Payer: Self-pay | Admitting: Pulmonary Disease

## 2019-05-07 NOTE — Telephone Encounter (Signed)
It looks like Suzanne Marshall has been trying to reach out to the patient to get this scheduled.

## 2019-05-08 NOTE — Telephone Encounter (Signed)
Left a message for pt to call me back with ins info and then we can have [name] call her on Monday to set up HST [name]

## 2019-05-11 NOTE — Telephone Encounter (Signed)
lmam for pt to call back to schedule HST

## 2019-05-12 NOTE — Telephone Encounter (Signed)
lmam for pt to call back to schedule 

## 2019-05-12 NOTE — Telephone Encounter (Signed)
Pt called back and she is going to call me back once she is ready to schedule she has a lot of stuff going on now and just cant do it

## 2019-05-14 ENCOUNTER — Other Ambulatory Visit: Payer: Self-pay | Admitting: Family Medicine

## 2019-05-15 ENCOUNTER — Other Ambulatory Visit: Payer: Self-pay | Admitting: Obstetrics and Gynecology

## 2019-06-05 ENCOUNTER — Other Ambulatory Visit: Payer: Self-pay | Admitting: *Deleted

## 2019-06-05 MED ORDER — MONTELUKAST SODIUM 10 MG PO TABS: ORAL_TABLET | ORAL | 0 refills | Status: DC

## 2019-06-05 NOTE — Telephone Encounter (Signed)
Rx done. 

## 2019-06-11 ENCOUNTER — Other Ambulatory Visit: Payer: Self-pay | Admitting: Family Medicine

## 2019-07-16 DIAGNOSIS — G4700 Insomnia, unspecified: Secondary | ICD-10-CM | POA: Diagnosis not present

## 2019-07-16 DIAGNOSIS — Z01419 Encounter for gynecological examination (general) (routine) without abnormal findings: Secondary | ICD-10-CM | POA: Diagnosis not present

## 2019-07-16 DIAGNOSIS — I10 Essential (primary) hypertension: Secondary | ICD-10-CM | POA: Diagnosis not present

## 2019-07-16 DIAGNOSIS — Z6823 Body mass index (BMI) 23.0-23.9, adult: Secondary | ICD-10-CM | POA: Diagnosis not present

## 2019-08-06 ENCOUNTER — Other Ambulatory Visit: Payer: Self-pay | Admitting: Family Medicine

## 2019-09-01 DIAGNOSIS — I10 Essential (primary) hypertension: Secondary | ICD-10-CM | POA: Diagnosis not present

## 2019-09-01 DIAGNOSIS — Z13820 Encounter for screening for osteoporosis: Secondary | ICD-10-CM | POA: Diagnosis not present

## 2019-09-01 DIAGNOSIS — E785 Hyperlipidemia, unspecified: Secondary | ICD-10-CM | POA: Diagnosis not present

## 2019-09-04 ENCOUNTER — Encounter: Payer: Self-pay | Admitting: Family Medicine

## 2019-09-04 ENCOUNTER — Other Ambulatory Visit: Payer: Self-pay | Admitting: Obstetrics and Gynecology

## 2019-09-04 DIAGNOSIS — N6002 Solitary cyst of left breast: Secondary | ICD-10-CM

## 2019-09-15 ENCOUNTER — Telehealth (INDEPENDENT_AMBULATORY_CARE_PROVIDER_SITE_OTHER): Payer: Managed Care, Other (non HMO) | Admitting: Family Medicine

## 2019-09-15 ENCOUNTER — Other Ambulatory Visit: Payer: Self-pay

## 2019-09-15 ENCOUNTER — Telehealth: Payer: Self-pay | Admitting: Family Medicine

## 2019-09-15 ENCOUNTER — Encounter: Payer: Self-pay | Admitting: Family Medicine

## 2019-09-15 VITALS — Wt 135.0 lb

## 2019-09-15 DIAGNOSIS — E785 Hyperlipidemia, unspecified: Secondary | ICD-10-CM

## 2019-09-15 DIAGNOSIS — F419 Anxiety disorder, unspecified: Secondary | ICD-10-CM

## 2019-09-15 DIAGNOSIS — F329 Major depressive disorder, single episode, unspecified: Secondary | ICD-10-CM

## 2019-09-15 DIAGNOSIS — F32A Depression, unspecified: Secondary | ICD-10-CM

## 2019-09-15 NOTE — Progress Notes (Addendum)
Virtual Visit via Video Note  I connected with Suzanne Marshall  on 09/15/19 at  1:20 PM EDT by a video enabled telemedicine application and verified that I am speaking with the correct person using two identifiers.  Location patient: home Location provider:work or home office Persons participating in the virtual visit: patient, provider   HPI:  Acute visit for Hyperlipidemia. Has results (see scanned) in chart from Dr. Arelia Sneddon. Total cholesterol 226, HDL 45, trig 137, LDL 156, ratio 3.5. AHA CV risks 2.8% and asa and statin not recommended per AHA heart risk calculator based of last in office BP. She is getting a lot of aerobic exercise. She hikes a lot and swims. She is trying to eat healthy. Father had a heart attack at 34. No CP, SOB, DOE. Reports BP has bee good - really low in 1 teens or 120s/70. Reports was having a lot of anxiety with moving, building a house and selling old house, but now better. Saw Dr. Arelia Sneddon and increased her lexapro as decreasing the hormones made anxiety worse. Now doing well.  ROS: See pertinent positives and negatives per HPI.  Past Medical History:  Diagnosis Date  . Anxiety and depression 12/17/2014  . Arrhythmia 9 yrs ago   with pregnancy  . Asthma   . Cystitides, interstitial, chronic   . Depression   . History of irregular menstrual bleeding 12/17/2014   S/p eval with gyn, perimenopausal   . Hot flashes 12/17/2014  . Insomnia 12/17/2014  . Ulcerative colitis (HCC) 12/17/2014   Managed by Dr. Laural Benes     Past Surgical History:  Procedure Laterality Date  . COLONOSCOPY WITH PROPOFOL N/A 12/28/2014   Procedure: COLONOSCOPY WITH PROPOFOL;  Surgeon: Charolett Bumpers, MD;  Location: [facility];  Service: Endoscopy;  Laterality: N/A;  . colonscopy  last done 7 yrs ago   x 4  . DILATION AND CURETTAGE OF UTERUS  12 yrs ago  . RHINOPLASTY  yrs ago    Family History  Problem Relation Age of Onset  . Heart disease Father 28       MI  . Hypertension Father   .  Heart disease Maternal Grandmother   . Hypertension Mother     SOCIAL HX: see hpi   Current Outpatient Medications:  .  albuterol (VENTOLIN HFA) 108 (90 Base) MCG/ACT inhaler, INHALE 2 PUFFS INTO THE LUNGS EVERY 6 HOURS AS NEEDED FOR WHEEZING OR SHORTNESS OF BREATH, Disp: 8.5 g, Rfl: 0 .  cetirizine (ZYRTEC) 10 MG tablet, Take 5 mg by mouth at bedtime., Disp: , Rfl:  .  Cholecalciferol (VITAMIN D3 PO), Take 25 mcg by mouth daily., Disp: , Rfl:  .  escitalopram (LEXAPRO) 20 MG tablet, escitalopram 20 mg tablet  TK 1 T PO QD, Disp: , Rfl:  .  MAGNESIUM PO, Take by mouth daily., Disp: , Rfl:  .  montelukast (SINGULAIR) 10 MG tablet, TAKE 1 TABLET BY MOUTH EVERY NIGHT AT BEDTIME, Disp: 15 tablet, Rfl: 0 .  Multiple Vitamin (MULTIVITAMIN WITH MINERALS) TABS tablet, Take 1 tablet by mouth daily., Disp: , Rfl:  .  triamcinolone (NASACORT ALLERGY 24HR) 55 MCG/ACT AERO nasal inhaler, Place 2 sprays into the nose as needed. , Disp: , Rfl:  .  zolpidem (AMBIEN) 5 MG tablet, Take 1 tablet (5 mg total) by mouth at bedtime as needed for sleep (about once a week for sleep)., Disp: 30 tablet, Rfl: 0  EXAM:  VITALS per patient if applicable:  GENERAL: alert, oriented, appears well  and in no acute distress  HEENT: atraumatic, conjunttiva clear, no obvious abnormalities on inspection of external nose and ears  NECK: normal movements of the head and neck  LUNGS: on inspection no signs of respiratory distress, breathing rate appears normal, no obvious gross SOB, gasping or wheezing  CV: no obvious cyanosis  MS: moves all visible extremities without noticeable abnormality  PSYCH/NEURO: pleasant and cooperative, no obvious depression or anxiety, speech and thought processing grossly intact  ASSESSMENT AND PLAN:  Discussed the following assessment and plan:  Hyperlipidemia, unspecified hyperlipidemia type  Anxiety and depression  Discussed lab results at length and options for management.  According to the [facility] risk calculator statin not advised. However, given family hx discussed risks/benefits statin but opted to hold off. She plans to work on further tweaking her diet and exercise with recheck with Dr. Radene Knee (says already set up) in a few months. She may consider a statin in the future. Has TOC visit with Dr. Ethlyn Gallery in January. Advised of flu and tetanus boosters due. She plans to do soon.   I discussed the assessment and treatment plan with the patient. The patient was provided an opportunity to ask questions and all were answered. The patient agreed with the plan and demonstrated an understanding of the instructions.     Lucretia Kern, DO

## 2019-09-15 NOTE — Telephone Encounter (Signed)
error:[id number] ° °

## 2019-10-12 ENCOUNTER — Other Ambulatory Visit: Payer: Self-pay

## 2019-10-12 ENCOUNTER — Encounter: Payer: Self-pay | Admitting: Family Medicine

## 2019-10-12 ENCOUNTER — Ambulatory Visit (INDEPENDENT_AMBULATORY_CARE_PROVIDER_SITE_OTHER): Payer: BLUE CROSS/BLUE SHIELD

## 2019-10-12 DIAGNOSIS — Z23 Encounter for immunization: Secondary | ICD-10-CM | POA: Diagnosis not present

## 2019-10-13 DIAGNOSIS — N951 Menopausal and female climacteric states: Secondary | ICD-10-CM | POA: Diagnosis not present

## 2019-10-13 DIAGNOSIS — R030 Elevated blood-pressure reading, without diagnosis of hypertension: Secondary | ICD-10-CM | POA: Diagnosis not present

## 2019-10-22 ENCOUNTER — Other Ambulatory Visit: Payer: Self-pay | Admitting: Family Medicine

## 2019-10-23 ENCOUNTER — Other Ambulatory Visit: Payer: Self-pay | Admitting: *Deleted

## 2019-10-23 MED ORDER — MONTELUKAST SODIUM 10 MG PO TABS: ORAL_TABLET | ORAL | 0 refills | Status: DC

## 2019-11-11 DIAGNOSIS — I10 Essential (primary) hypertension: Secondary | ICD-10-CM | POA: Diagnosis not present

## 2019-12-02 DIAGNOSIS — R05 Cough: Secondary | ICD-10-CM | POA: Diagnosis not present

## 2019-12-02 DIAGNOSIS — J111 Influenza due to unidentified influenza virus with other respiratory manifestations: Secondary | ICD-10-CM | POA: Diagnosis not present

## 2019-12-02 DIAGNOSIS — Z9189 Other specified personal risk factors, not elsewhere classified: Secondary | ICD-10-CM | POA: Diagnosis not present

## 2019-12-24 ENCOUNTER — Encounter: Payer: Self-pay | Admitting: Family Medicine

## 2019-12-25 NOTE — Telephone Encounter (Signed)
Ok to change to virtual.  

## 2019-12-26 ENCOUNTER — Other Ambulatory Visit: Payer: Self-pay | Admitting: Family Medicine

## 2019-12-30 ENCOUNTER — Encounter: Payer: Self-pay | Admitting: Family Medicine

## 2019-12-30 ENCOUNTER — Other Ambulatory Visit: Payer: Self-pay

## 2019-12-30 ENCOUNTER — Telehealth (INDEPENDENT_AMBULATORY_CARE_PROVIDER_SITE_OTHER): Payer: BLUE CROSS/BLUE SHIELD | Admitting: Family Medicine

## 2019-12-30 VITALS — BP 120/80 | Wt 140.0 lb

## 2019-12-30 DIAGNOSIS — F419 Anxiety disorder, unspecified: Secondary | ICD-10-CM

## 2019-12-30 DIAGNOSIS — K51919 Ulcerative colitis, unspecified with unspecified complications: Secondary | ICD-10-CM

## 2019-12-30 DIAGNOSIS — E785 Hyperlipidemia, unspecified: Secondary | ICD-10-CM

## 2019-12-30 DIAGNOSIS — E559 Vitamin D deficiency, unspecified: Secondary | ICD-10-CM

## 2019-12-30 DIAGNOSIS — F329 Major depressive disorder, single episode, unspecified: Secondary | ICD-10-CM

## 2019-12-30 DIAGNOSIS — R739 Hyperglycemia, unspecified: Secondary | ICD-10-CM

## 2019-12-30 DIAGNOSIS — J4520 Mild intermittent asthma, uncomplicated: Secondary | ICD-10-CM | POA: Diagnosis not present

## 2019-12-30 DIAGNOSIS — G4700 Insomnia, unspecified: Secondary | ICD-10-CM

## 2019-12-30 DIAGNOSIS — F32A Depression, unspecified: Secondary | ICD-10-CM

## 2019-12-30 MED ORDER — ALBUTEROL SULFATE HFA 108 (90 BASE) MCG/ACT IN AERS: INHALATION_SPRAY | RESPIRATORY_TRACT | 1 refills | Status: DC

## 2019-12-30 MED ORDER — ESCITALOPRAM OXALATE 20 MG PO TABS: ORAL_TABLET | ORAL | 1 refills | Status: DC

## 2019-12-30 MED ORDER — MONTELUKAST SODIUM 10 MG PO TABS: ORAL_TABLET | ORAL | 1 refills | Status: DC

## 2019-12-30 NOTE — Telephone Encounter (Signed)
Patient is presently connected with Dr Hassan Rowan for visit.

## 2019-12-30 NOTE — Progress Notes (Signed)
Virtual Visit via Video Note  I connected with Suzanne Marshall   on 12/30/19 at 10:45 AM EST by a video enabled telemedicine application and verified that I am speaking with the correct person using two identifiers.  Location patient: home Location provider:work office Persons participating in the virtual visit: patient, provider  I discussed the limitations of evaluation and management by telemedicine and the availability of in person appointments. The patient expressed understanding and agreed to proceed.   Suzanne Marshall DOB: [birth year] Encounter date: 12/30/2019  This is a 59 y.o. female who presents to establish care. Chief Complaint  Patient presents with  . Establish Care    History of present illness: Had flu A this winter. Had a fever for 12 days; was managed virtually and was difficult to recover from.   Asthma: has generally been well controlled. Takes singulair at night which helps a lot, but has to use albuterol some; didn't even use it when she had flu. Notes too w exercise or with cold weather that it triggers cough.   Ulcerative colitis has been stable. Last time she was scoped they didn't see much. Followed with gastro (but doc retired). Hasn't reached out to follow up with anyone else.   Has always had some anxiety; nothing horrible where she couldn't function. Lost step son 11 years ago and then had more depression at that time. Thinks anxiety coming from more hormonal.   Past Medical History:  Diagnosis Date  . Anxiety and depression 12/17/2014  . Arrhythmia 9 yrs ago   with pregnancy  . Asthma   . Cystitides, interstitial, chronic   . Depression   . History of irregular menstrual bleeding 12/17/2014   S/p eval with gyn, perimenopausal   . Hot flashes 12/17/2014  . Insomnia 12/17/2014  . Ulcerative colitis (HCC) 12/17/2014   Managed by Dr. Laural Benes    Past Surgical History:  Procedure Laterality Date  . COLONOSCOPY WITH PROPOFOL N/A 12/28/2014   Procedure:  COLONOSCOPY WITH PROPOFOL;  Surgeon: Charolett Bumpers, MD;  Location: [facility];  Service: Endoscopy;  Laterality: N/A;  . colonscopy  last done 7 yrs ago   x 4  . DILATION AND CURETTAGE OF UTERUS  12 yrs ago  . RHINOPLASTY  yrs ago   Allergies  Allergen Reactions  . Ibuprofen Other (See Comments)    GI upset per patient  . Sulfa Antibiotics Rash    And high fever   Current Meds  Medication Sig  . albuterol (VENTOLIN HFA) 108 (90 Base) MCG/ACT inhaler INHALE 2 PUFFS INTO THE LUNGS EVERY 6 HOURS AS NEEDED FOR WHEEZING OR SHORTNESS OF BREATH  . cetirizine (ZYRTEC) 10 MG tablet Take 5 mg by mouth at bedtime.  . Cholecalciferol (VITAMIN D3 PO) Take 25 mcg by mouth daily.  Marland Kitchen escitalopram (LEXAPRO) 20 MG tablet 1 tablet PO daily  . MAGNESIUM PO Take by mouth daily.  . montelukast (SINGULAIR) 10 MG tablet TAKE 1 TABLET BY MOUTH DAILY  . Multiple Vitamin (MULTIVITAMIN WITH MINERALS) TABS tablet Take 1 tablet by mouth daily.  Marland Kitchen triamcinolone (NASACORT ALLERGY 24HR) 55 MCG/ACT AERO nasal inhaler Place 2 sprays into the nose as needed.   . zolpidem (AMBIEN) 5 MG tablet Take 1 tablet (5 mg total) by mouth at bedtime as needed for sleep (about once a week for sleep).  . [DISCONTINUED] albuterol (VENTOLIN HFA) 108 (90 Base) MCG/ACT inhaler INHALE 2 PUFFS INTO THE LUNGS EVERY 6 HOURS AS NEEDED FOR WHEEZING OR SHORTNESS OF BREATH  . [  DISCONTINUED] escitalopram (LEXAPRO) 20 MG tablet escitalopram 20 mg tablet  TK 1 T PO QD  . [DISCONTINUED] montelukast (SINGULAIR) 10 MG tablet TAKE 1 TABLET BY MOUTH DAILY   Social History   Tobacco Use  . Smoking status: Former Research scientist (life sciences)  . Smokeless tobacco: Never Used  . Tobacco comment: smoked a very little bit in highschool but she does not consider herself a regular smoker and hasn't smoked since  Substance Use Topics  . Alcohol use: Yes    Alcohol/week: 0.0 standard drinks    Comment: rare, light   Family History  Problem Relation Age of Onset  . Heart  disease Father 50       MI  . Hypertension Father   . Heart disease Maternal Grandmother   . Hypertension Mother      Review of Systems  Constitutional: Negative for chills, fatigue and fever.  Respiratory: Negative for cough, chest tightness, shortness of breath and wheezing.   Cardiovascular: Negative for chest pain, palpitations and leg swelling.    Objective:  BP 120/80   Wt 140 lb (63.5 kg)   LMP 12/06/2014   BMI 21.76 kg/m   Weight: 140 lb (63.5 kg)   BP Readings from Last 3 Encounters:  12/30/19 120/80  01/30/19 124/76  01/19/19 120/78   Wt Readings from Last 3 Encounters:  12/30/19 140 lb (63.5 kg)  09/15/19 135 lb (61.2 kg)  01/30/19 149 lb (67.6 kg)    EXAM:  GENERAL: alert, oriented, appears well and in no acute distress  HEENT: atraumatic, conjunctiva clear, no obvious abnormalities on inspection of external nose and ears  NECK: normal movements of the head and neck  LUNGS: on inspection no signs of respiratory distress, breathing rate appears normal, no obvious gross SOB, gasping or wheezing  CV: no obvious cyanosis  MS: moves all visible extremities without noticeable abnormality  PSYCH/NEURO: pleasant and cooperative, no obvious depression or anxiety, speech and thought processing grossly intact  SKIN: no facial or neck abnormalities noted.  Assessment/Plan 1. Ulcerative colitis with complication, unspecified location [facility]) Her specialist retired.  We will refer her so that she can establish care with a new specialist.  Symptoms have been well controlled. - Ambulatory referral to Gastroenterology - CBC with Differential/Platelet; Future  2. Mild intermittent asthma without complication Asthma has been well controlled.  Continue current medications.  3. Anxiety and depression Stable.  Continue Lexapro.  4. Insomnia, unspecified type Does well with Ambien as needed.  5. Vitamin D deficiency - VITAMIN D 25 Hydroxy (Vit-D Deficiency,  Fractures); Future  6. Hyperlipidemia, unspecified hyperlipidemia type - Comprehensive metabolic panel; Future - Lipid panel; Future  7. Hyperglycemia - Hemoglobin A1c; Future    Return in about 3 months (around 03/29/2020) for physical exam.   I discussed the assessment and treatment plan with the patient. The patient was provided an opportunity to ask questions and all were answered. The patient agreed with the plan and demonstrated an understanding of the instructions.   The patient was advised to call back or seek an in-person evaluation if the symptoms worsen or if the condition fails to improve as anticipated.  I provided 23 minutes of non-face-to-face time during this encounter.   Micheline Rough, MD

## 2019-12-31 ENCOUNTER — Telehealth: Payer: Self-pay | Admitting: *Deleted

## 2019-12-31 NOTE — Telephone Encounter (Signed)
-----   Message from Wynn Banker, MD sent at 12/31/2019 10:17 AM EST ----- Please set up bloodwork for her and physical in 3 months. She wanted to wait until COVID was better so ok to push out for awhile. Labs prior to visit. She was interested in Tdap so could get at lab draw or at visit.

## 2019-12-31 NOTE — Telephone Encounter (Signed)
I left a detailed message at the pts cell number to call the office to schedule a lab appt 1 week prior to the physical exam in 3 months.  I also left a message stating the Tdap can be given during the appointment for the physical exam.

## 2020-02-03 ENCOUNTER — Ambulatory Visit (INDEPENDENT_AMBULATORY_CARE_PROVIDER_SITE_OTHER): Payer: BLUE CROSS/BLUE SHIELD | Admitting: Gastroenterology

## 2020-02-03 ENCOUNTER — Encounter: Payer: Self-pay | Admitting: Gastroenterology

## 2020-02-03 VITALS — Ht 66.0 in | Wt 142.0 lb

## 2020-02-03 DIAGNOSIS — Z8719 Personal history of other diseases of the digestive system: Secondary | ICD-10-CM

## 2020-02-03 NOTE — Patient Instructions (Signed)
If you are age 58 or older, your body mass index should be between 23-30. Your Body mass index is 22.92 kg/m. If this is out of the aforementioned range listed, please consider follow up with your Primary Care Provider.  If you are age 29 or younger, your body mass index should be between 19-25. Your Body mass index is 22.92 kg/m. If this is out of the aformentioned range listed, please consider follow up with your Primary Care Provider.   Recall for a 3 month follow up has been placed.   It was a pleasure to see you today!  Dr. Myrtie Neither

## 2020-02-03 NOTE — Progress Notes (Addendum)
This patient contacted our office requesting a physician telemedicine consultation regarding clinical questions and/or test results. Due to COVID restrictions, this was felt to be the most appropriate method of patient evaluation. If new patient, they were referred by PCP noted below  Interactive audio and video telecommunications were attempted between this provider and the patient.  However, this technology failed due to the patient having technical difficulties OR they did not have access to video capabilities.  We continued and completed the visit with audio only.  Participants on the conference : myself and patient   The patient consented to this consultation and was aware that a charge will be placed through their insurance.  They were also made aware of the limitations of telemedicine.  I was in my office and the patient was at home.   Encounter time:  Total time 30 minutes, with 20 minutes spent with patient on Doximity/phone   _____________________________________________________________________________________________               [facility] Consult Note:  History: Suzanne Marshall 02/03/2020  Referring provider: Wynn Banker, MD  Reason for consult/chief complaint: Establish Care (History of ulcerative colitis )   Subjective  HPI: Reports Dx UC age 38 Dr. Thurston Hole (? Sp), then [facility]-  Was having mucus and blood,tenesums Took steroids, then something by suppository or enema.    No meds last 20 years, no problems since other than rare abdominal cramps. Typically daily BM.  Feeling generally well for a long period of time.  Wanted to establish care with a new provider, wanted to discuss when she should next have colorectal cancer screening.  Denies any chronic upper digestive symptoms. ROS:  Review of Systems  Constitutional: Negative for appetite change and unexpected weight change.  HENT: Negative for mouth sores and voice change.    Eyes: Negative for pain and redness.  Respiratory: Negative for cough and shortness of breath.   Cardiovascular: Negative for chest pain and palpitations.  Genitourinary: Negative for dysuria and hematuria.  Musculoskeletal: Negative for arthralgias and myalgias.  Skin: Negative for pallor and rash.  Allergic/Immunologic: Positive for environmental allergies.  Neurological: Negative for weakness and headaches.  Hematological: Negative for adenopathy.  Psychiatric/Behavioral:       Anxiety     Past Medical History: Past Medical History:  Diagnosis Date  . Allergic rhinitis   . Anxiety and depression 12/17/2014  . Arrhythmia 9 yrs ago   with pregnancy  . Asthma   . Cystitides, interstitial, chronic   . Depression   . History of irregular menstrual bleeding 12/17/2014   S/p eval with gyn, perimenopausal   . Hot flashes 12/17/2014  . Insomnia 12/17/2014  . Plantar fascial fibromatosis   . Ulcerative colitis (HCC) 12/17/2014   Managed by Dr. Laural Benes dx at 59 yrs old     Past Surgical History: Past Surgical History:  Procedure Laterality Date  . COLONOSCOPY WITH PROPOFOL N/A 12/28/2014   Procedure: COLONOSCOPY WITH PROPOFOL;  Surgeon: Charolett Bumpers, MD;  Location: [facility];  Service: Endoscopy;  Laterality: N/A;  . colonscopy  last done 7 yrs ago   x 4  . DILATION AND CURETTAGE OF UTERUS  12 yrs ago  . RHINOPLASTY  yrs ago     Family History: Family History  Problem Relation Age of Onset  . Heart disease Father 60       MI  . Hypertension Father   . Bladder Cancer Father   . Heart disease Maternal Grandmother   .  Hypertension Mother   . Cancer Maternal Grandfather   . Colon cancer Neg Hx   . Stomach cancer Neg Hx   . Esophageal cancer Neg Hx   . Pancreatic cancer Neg Hx    No CRC , no IBD  Social History: Social History   Socioeconomic History  . Marital status: Married    Spouse name: Not on file  . Number of children: Not on file  . Years of  education: Not on file  . Highest education level: Not on file  Occupational History  . Not on file  Tobacco Use  . Smoking status: Former Smoker    Types: Cigarettes  . Smokeless tobacco: Never Used  . Tobacco comment: smoked a very little bit in highschool but she does not consider herself a regular smoker and hasn't smoked since  Substance and Sexual Activity  . Alcohol use: Yes    Alcohol/week: 0.0 standard drinks    Comment: rare, light   . Drug use: No  . Sexual activity: Yes    Partners: Male  Other Topics Concern  . 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 Difficulty of Paying Living Expenses: Not on file  Food Insecurity:   . Worried About Charity fundraiser in the Last Year: Not on file  . Ran Out of Food in the Last Year: Not on file  Transportation Needs:   . Lack of Transportation (Medical): Not on file  . Lack of Transportation (Non-Medical): Not on file  Physical Activity:   . Days of Exercise per Week: Not on file  . Minutes of Exercise per Session: Not on file  Stress:   . Feeling of Stress : Not on file  Social Connections:   . Frequency of Communication with Friends and Family: Not on file  . Frequency of Social Gatherings with Friends and Family: Not on file  . Attends Religious Services: Not on file  . Active Member of Clubs or Organizations: Not on file  . Attends Archivist Meetings: Not on file  . Marital Status: Not on file    Allergies: Allergies  Allergen Reactions  . Ibuprofen Other (See Comments)    GI upset per patient  . Sulfa Antibiotics Rash    And high fever    Outpatient Meds: Current Outpatient Medications  Medication  Sig Dispense Refill  . albuterol (VENTOLIN HFA) 108 (90 Base) MCG/ACT inhaler INHALE 2 PUFFS INTO THE LUNGS EVERY 6 HOURS AS NEEDED FOR WHEEZING OR SHORTNESS OF BREATH 8.5 g 1  . cetirizine (ZYRTEC) 10 MG tablet Take 5 mg by mouth at bedtime.    . Cholecalciferol (VITAMIN D3 PO) Take 25 mcg by mouth daily.    Marland Kitchen escitalopram (LEXAPRO) 20 MG tablet 1 tablet PO daily 90 tablet 1  . MAGNESIUM PO Take by mouth daily.    . montelukast (SINGULAIR) 10 MG tablet TAKE 1 TABLET BY MOUTH DAILY 90 tablet 1  . Multiple Vitamin (MULTIVITAMIN WITH MINERALS) TABS tablet Take 1 tablet by mouth daily.    Marland Kitchen triamcinolone (NASACORT ALLERGY 24HR) 55 MCG/ACT AERO nasal inhaler Place 2  sprays into the nose as needed.     . zolpidem (AMBIEN) 5 MG tablet Take 1 tablet (5 mg total) by mouth at bedtime as needed for sleep (about once a week for sleep). 30 tablet 0   No current facility-administered medications for this visit.     ___________________________________________________________________ No exam - virtual visit   Endoscopy report:  Colonoscopy by Dr. Reece Agar dated 12/28/2014 indicates a clinical history of ulcerative colitis diagnosed at age 62.  The entire exam was reportedly normal.  Surveillance biopsies were taken from the right colon, transverse colon, descending and rectosigmoid area.  All biopsies revealed benign colonic mucosa with lymphoid aggregates, no active colitis, no dysplasia or malignancy.  Assessment: Encounter Diagnosis  Name Primary?  . History of ulcerative colitis Yes   The circumstances and testing of the original diagnosis are unclear.  If it was an accurate diagnosis, the patient has had decades of durable remission without therapy.  No overt or even microscopic evidence of colitis on last colonoscopy 5 years ago.  Therefore, she does not need any medicines at present, and would be considered average risk for colorectal cancer.  Recommendations are for a repeat colonoscopy in  10 years.  She wants to continue with our practice, so we will put her in our recall database for colonoscopy reminder in January 2026.   Plan:  Would be glad to see her as needed in the meantime.  Thank you for the courtesy of this consult.  Please call me with any questions or concerns.  Charlie Pitter III  CC: Referring provider noted above

## 2020-02-23 DIAGNOSIS — N951 Menopausal and female climacteric states: Secondary | ICD-10-CM | POA: Diagnosis not present

## 2020-02-23 DIAGNOSIS — R5383 Other fatigue: Secondary | ICD-10-CM | POA: Diagnosis not present

## 2020-03-02 DIAGNOSIS — G479 Sleep disorder, unspecified: Secondary | ICD-10-CM | POA: Diagnosis not present

## 2020-03-02 DIAGNOSIS — N951 Menopausal and female climacteric states: Secondary | ICD-10-CM | POA: Diagnosis not present

## 2020-03-02 DIAGNOSIS — R232 Flushing: Secondary | ICD-10-CM | POA: Diagnosis not present

## 2020-03-02 DIAGNOSIS — R5383 Other fatigue: Secondary | ICD-10-CM | POA: Diagnosis not present

## 2020-03-30 DIAGNOSIS — N951 Menopausal and female climacteric states: Secondary | ICD-10-CM | POA: Diagnosis not present

## 2020-03-30 DIAGNOSIS — R232 Flushing: Secondary | ICD-10-CM | POA: Diagnosis not present

## 2020-03-30 DIAGNOSIS — G479 Sleep disorder, unspecified: Secondary | ICD-10-CM | POA: Diagnosis not present

## 2020-07-30 ENCOUNTER — Other Ambulatory Visit: Payer: Self-pay | Admitting: Family Medicine

## 2020-09-03 ENCOUNTER — Other Ambulatory Visit: Payer: Self-pay | Admitting: Family Medicine

## 2020-11-11 ENCOUNTER — Other Ambulatory Visit: Payer: Self-pay | Admitting: Obstetrics and Gynecology

## 2020-11-11 DIAGNOSIS — N6002 Solitary cyst of left breast: Secondary | ICD-10-CM

## 2020-12-06 ENCOUNTER — Telehealth: Payer: BLUE CROSS/BLUE SHIELD | Admitting: Physician Assistant

## 2020-12-06 ENCOUNTER — Encounter: Payer: Self-pay | Admitting: Family Medicine

## 2020-12-06 DIAGNOSIS — Z20822 Contact with and (suspected) exposure to covid-19: Secondary | ICD-10-CM

## 2020-12-06 MED ORDER — FLUTICASONE PROPIONATE 50 MCG/ACT NA SUSP: 2.0000 | Freq: Every day | NASAL | 0 refills | Status: DC

## 2020-12-06 MED ORDER — BENZONATATE 100 MG PO CAPS: 100.0000 mg | ORAL_CAPSULE | Freq: Three times a day (TID) | ORAL | 0 refills | Status: DC | PRN

## 2020-12-06 NOTE — Progress Notes (Signed)
E-Visit for Corona Virus Screening  Your current symptoms could be consistent with the coronavirus.  I saw your communication with your PCP earlier about [facility] not having tests, but concern for your exposure.  At this point, your symptoms are likely viral and from COVID.  Please schedule testing a one of the [facility] facilities.  This e-visit will be visible to them and they will be able to give you an appointment.  In the meantime, I am going to prescribe some medications to help with symptoms.   Many health care providers can now test patients at their office but not all are.  [place] has multiple testing sites. For information on our COVID testing locations and hours go to [email or web address]  We are enrolling you in our MyChart Home Monitoring for COVID19 . Daily you will receive a questionnaire within the MyChart website. Our COVID 19 response team will be monitoring your responses daily.  Testing Information: The COVID-19 Community Testing sites are testing BY APPOINTMENT ONLY.  You can schedule online at [email or web address]  If you do not have access to a smart phone or computer you may call [phone] for an appointment.   Additional testing sites in the Community:  . For [facility] Testing sites in [facility]  [email or web address]  . For Pop-up testing sites in [place]  [email or web address]  . For [facility] [email or web address]  . For [facility] testing in [place] and [employer] [email or web address]  . For [facility] testing in [facility]   [email or web address]  For  more information about community testing call [phone]   Please  quarantine yourself while awaiting your test results. Please stay home for a minimum of 10 days from the first day of illness with improving symptoms and you have had 24 hours of no fever (without the use of Tylenol (Acetaminophen) Motrin (Ibuprofen) or any fever reducing medication).  Also - Do not get tested prior to returning to work because once you have had a positive test the test can stay positive for more than a month in some cases.   You should wear a mask or cloth face covering over your nose and mouth if you must be around other people or animals, including pets (even at home). Try to stay at least 6 feet away from other people. This will protect the people around you.  Please continue good preventive care measures, including:  frequent hand-washing, avoid touching your face, cover coughs/sneezes, stay out of crowds and keep a 6 foot distance from others.  COVID-19 is a respiratory illness with symptoms that are similar to the flu. Symptoms are typically mild to moderate, but there have been cases of severe illness and death due to the virus.   The following symptoms may appear 2-14 days after exposure: . Fever . Cough . Shortness of breath or difficulty breathing . Chills . Repeated shaking with chills . Muscle pain . Headache . Sore throat . New loss of taste or smell . Fatigue . Congestion or runny nose . Nausea or vomiting . Diarrhea  Go to the nearest hospital ED for assessment if fever/cough/breathlessness are severe or illness seems like a threat to life.  It is vitally important that if you feel that you have an infection such as this virus or any other virus that you stay home and away from places where you may spread it to others.  You should avoid contact with people age 58 and older.   You can use medication such as  A prescription cough medication called Tessalon Perles 100 mg. You may take 1-2 capsules every 8 hours as needed for cough and A prescription for Fluticasone  nasal spray 2 sprays in each nostril one time per day  You may also take acetaminophen (Tylenol) as needed for fever.  Reduce your risk of any infection by using the same precautions used for avoiding the common cold or flu:  Marland Kitchen Wash your hands often with soap and warm water for at least 20 seconds.  If soap and water are not readily available, use an alcohol-based hand sanitizer with at least 60% alcohol.  . If coughing or sneezing, cover your mouth and nose by coughing or sneezing into the elbow areas of your shirt or coat, into a tissue or into your sleeve (not your hands). . Avoid shaking hands with others and consider head nods or verbal greetings only. . Avoid touching your eyes, nose, or mouth with unwashed hands.  . Avoid close contact with people who are sick. . Avoid places or events with large numbers of people in one location, like concerts or sporting events. . Carefully consider travel plans you have or are making. . If you are planning any travel outside or inside the [place], visit the CDC's Travelers' Health webpage for the latest health notices. . If you have some symptoms but not all symptoms, continue to monitor at home and seek medical attention if your symptoms worsen. . If you are having a medical emergency, call 911.  HOME CARE . Only take medications as instructed by your medical team. . Drink plenty of fluids and get plenty of rest. . A steam or ultrasonic humidifier can help if you have congestion.   GET HELP RIGHT AWAY IF YOU HAVE EMERGENCY WARNING SIGNS** FOR COVID-19. If you or someone is showing any of these signs seek emergency medical care immediately. Call 911 or proceed to your closest emergency facility if: . You develop worsening high fever. . Trouble breathing . Bluish lips or face . Persistent pain or pressure in the chest . New confusion . Inability to wake or stay awake . You cough up blood. . Your symptoms become more severe  **This list is not all  possible symptoms. Contact your medical provider for any symptoms that are sever or concerning to you.  MAKE SURE YOU   Understand these instructions.  Will watch your condition.  Will get help right away if you are not doing well or get worse.  Your e-visit answers were reviewed by a board certified advanced clinical practitioner to complete your personal care plan.  Depending on the condition, your plan could have included both over the counter or prescription medications.  If there is a problem please reply once you have received a response from your provider.  Your safety is important to [place].  If you have drug allergies check your prescription carefully.    You can use MyChart to ask questions about today's visit, request a non-urgent call back, or ask for a work or school excuse for 24 hours related to this e-Visit. If it has been greater than 24 hours you will need to follow up with your provider, or enter a new e-Visit to address those concerns. You will get an e-mail in the next two days asking about your experience.  I hope that your e-visit has been valuable and will speed your recovery. Thank you for using e-visits.   Greater than 5 minutes, yet less than 10 minutes of  time have been spent researching, coordinating, and implementing care for this patient today

## 2020-12-07 ENCOUNTER — Other Ambulatory Visit: Payer: Self-pay | Admitting: Family Medicine

## 2020-12-16 ENCOUNTER — Other Ambulatory Visit: Payer: BLUE CROSS/BLUE SHIELD

## 2021-01-30 ENCOUNTER — Other Ambulatory Visit: Payer: Self-pay | Admitting: Family Medicine

## 2021-02-08 ENCOUNTER — Other Ambulatory Visit: Payer: Self-pay

## 2021-02-08 ENCOUNTER — Ambulatory Visit
Admission: RE | Admit: 2021-02-08 | Discharge: 2021-02-08 | Disposition: A | Discharge status: Home / Self Care | Payer: BLUE CROSS/BLUE SHIELD | Source: Ambulatory Visit | Attending: Obstetrics and Gynecology | Admitting: Obstetrics and Gynecology

## 2021-02-08 DIAGNOSIS — R922 Inconclusive mammogram: Secondary | ICD-10-CM | POA: Diagnosis not present

## 2021-02-08 DIAGNOSIS — N6002 Solitary cyst of left breast: Secondary | ICD-10-CM

## 2021-02-08 DIAGNOSIS — N6489 Other specified disorders of breast: Secondary | ICD-10-CM | POA: Diagnosis not present

## 2021-03-04 ENCOUNTER — Other Ambulatory Visit: Payer: Self-pay | Admitting: Family Medicine

## 2021-04-04 ENCOUNTER — Other Ambulatory Visit: Payer: Self-pay | Admitting: Family Medicine

## 2021-04-06 ENCOUNTER — Encounter: Payer: Self-pay | Admitting: Family Medicine

## 2021-04-06 MED ORDER — MONTELUKAST SODIUM 10 MG PO TABS: 1.0000 | ORAL_TABLET | Freq: Every day | ORAL | 1 refills | Status: DC

## 2021-04-06 MED ORDER — ESCITALOPRAM OXALATE 20 MG PO TABS: 1.0000 | ORAL_TABLET | Freq: Every day | ORAL | 1 refills | Status: DC

## 2021-05-10 ENCOUNTER — Ambulatory Visit: Payer: BLUE CROSS/BLUE SHIELD | Admitting: Family Medicine

## 2021-05-10 ENCOUNTER — Other Ambulatory Visit: Payer: Self-pay

## 2021-05-10 ENCOUNTER — Encounter: Payer: Self-pay | Admitting: Family Medicine

## 2021-05-10 VITALS — BP 100/68 | HR 79 | Temp 98.5°F | Ht 66.0 in | Wt 148.5 lb

## 2021-05-10 DIAGNOSIS — R002 Palpitations: Secondary | ICD-10-CM | POA: Diagnosis not present

## 2021-05-10 DIAGNOSIS — E785 Hyperlipidemia, unspecified: Secondary | ICD-10-CM

## 2021-05-10 DIAGNOSIS — J302 Other seasonal allergic rhinitis: Secondary | ICD-10-CM | POA: Diagnosis not present

## 2021-05-10 DIAGNOSIS — K51919 Ulcerative colitis, unspecified with unspecified complications: Secondary | ICD-10-CM

## 2021-05-10 DIAGNOSIS — R7301 Impaired fasting glucose: Secondary | ICD-10-CM

## 2021-05-10 DIAGNOSIS — F419 Anxiety disorder, unspecified: Secondary | ICD-10-CM

## 2021-05-10 DIAGNOSIS — J4520 Mild intermittent asthma, uncomplicated: Secondary | ICD-10-CM

## 2021-05-10 DIAGNOSIS — R319 Hematuria, unspecified: Secondary | ICD-10-CM

## 2021-05-10 DIAGNOSIS — Z0000 Encounter for general adult medical examination without abnormal findings: Secondary | ICD-10-CM | POA: Diagnosis not present

## 2021-05-10 DIAGNOSIS — F32A Depression, unspecified: Secondary | ICD-10-CM

## 2021-05-10 DIAGNOSIS — G4700 Insomnia, unspecified: Secondary | ICD-10-CM

## 2021-05-10 MED ORDER — ESCITALOPRAM OXALATE 20 MG PO TABS: 1.0000 | ORAL_TABLET | Freq: Every day | ORAL | 1 refills | Status: DC

## 2021-05-10 MED ORDER — MONTELUKAST SODIUM 10 MG PO TABS: 1.0000 | ORAL_TABLET | Freq: Every day | ORAL | 1 refills | Status: DC

## 2021-05-10 MED ORDER — ZOLPIDEM TARTRATE 5 MG PO TABS: 5.0000 mg | ORAL_TABLET | Freq: Every evening | ORAL | 0 refills | Status: DC | PRN

## 2021-05-10 MED ORDER — ALBUTEROL SULFATE HFA 108 (90 BASE) MCG/ACT IN AERS: INHALATION_SPRAY | RESPIRATORY_TRACT | 1 refills | Status: DC

## 2021-05-10 MED ORDER — PREMARIN 0.625 MG/GM VA CREA: 1.0000 | TOPICAL_CREAM | VAGINAL | 12 refills | Status: DC

## 2021-05-10 NOTE — Progress Notes (Signed)
Suzanne Marshall DOB: 24-Jul-1961 Encounter date: 05/10/2021  This is a 60 y.o. female who presents for complete physical   History of present illness/Additional concerns: Last visit was 12/2019 to establish care (virtual)  Seasonal allergies: using nasacort, zyrtec which helps. Usually just half zyrtec. Does still take singulair - helps with asthma.   Asthma: rare use of albuterol.   Ulcerative colitis: she is following with Dr. Myrtie Neither; last visit was 02/03/20 - and per his note: The circumstances and testing of the original diagnosis are unclear.  If it was an accurate diagnosis, the patient has had decades of durable remission without therapy.  No overt or even microscopic evidence of colitis on last colonoscopy 5 years ago.  Therefore, she does not need any medicines at present, and would be considered average risk for colorectal cancer.  Recommendations are for a repeat colonoscopy in 10 years.  She wants to continue with our practice, so we will put her in our recall database for colonoscopy reminder in January 2026.   Insomnia: uses ambien as needed. Tries not to overuse. Energy level is ok when she does sleep.   Had 102 fever after first COVID shot and had some pain in chest after second one - also noted heart skipping around more, esp with laying down. Not sure how much is related to anxiety. Had this when pregnant.    Still doing the lexapro 20mg  daily. Feels like this is good dose for her. Would eventually like to cut back, but doesn't feel like she is ready yet. She was on 10mg  for a long time and went up to 20 peri-menopausal time to help with some of mood fluctuations she suspects were from hormonal swings.   Using the premarin - sees Dr. .  Sees Dr. for derm needs.  She bikes, walks, swims (in summer) regularly.    Past Medical History:  Diagnosis Date  . Allergic rhinitis   . Anxiety and depression 12/17/2014  . Arrhythmia 9 yrs ago   with pregnancy  . Asthma    . Cystitides, interstitial, chronic   . Depression   . History of irregular menstrual bleeding 12/17/2014   S/p eval with gyn, perimenopausal   . Hot flashes 12/17/2014  . Insomnia 12/17/2014  . Plantar fascial fibromatosis   . Ulcerative colitis (HCC) 12/17/2014   Managed by Dr. 02/15/2015 dx at 60 yrs old   Past Surgical History:  Procedure Laterality Date  . COLONOSCOPY WITH PROPOFOL N/A 12/28/2014   Procedure: COLONOSCOPY WITH PROPOFOL;  Surgeon: 37, MD;  Location: [facility];  Service: Endoscopy;  Laterality: N/A;  . colonscopy  last done 7 yrs ago   x 4  . DILATION AND CURETTAGE OF UTERUS  12 yrs ago  . RHINOPLASTY  yrs ago   Allergies  Allergen Reactions  . Ibuprofen Other (See Comments)    GI upset per patient  . Sulfa Antibiotics Rash    And high fever   Current Meds  Medication Sig  . cetirizine (ZYRTEC) 10 MG tablet Take 5 mg by mouth at bedtime.  . Cholecalciferol (VITAMIN D3 PO) Take 25 mcg by mouth daily.  12/30/2014 ON 05/11/2021] conjugated estrogens (PREMARIN) vaginal cream Place 1 Applicatorful vaginally 2 (two) times a week.  . Multiple Vitamin (MULTIVITAMIN WITH MINERALS) TABS tablet Take 1 tablet by mouth daily.  Melene Muller triamcinolone (NASACORT) 55 MCG/ACT AERO nasal inhaler Place 2 sprays into the nose as needed.   . [DISCONTINUED] albuterol (VENTOLIN HFA) 108 (  90 Base) MCG/ACT inhaler INHALE 2 PUFFS INTO THE LUNGS EVERY 6 HOURS AS NEEDED FOR WHEEZING OR SHORTNESS OF BREATH  . [DISCONTINUED] escitalopram (LEXAPRO) 20 MG tablet Take 1 tablet (20 mg total) by mouth daily.  . [DISCONTINUED] montelukast (SINGULAIR) 10 MG tablet Take 1 tablet (10 mg total) by mouth daily.  . [DISCONTINUED] zolpidem (AMBIEN) 5 MG tablet Take 1 tablet (5 mg total) by mouth at bedtime as needed for sleep (about once a week for sleep).   Social History   Tobacco Use  . Smoking status: Former Smoker    Types: Cigarettes  . Smokeless tobacco: Never Used  . Tobacco comment: smoked  a very little bit in highschool but she does not consider herself a regular smoker and hasn't smoked since  Substance Use Topics  . Alcohol use: Yes    Alcohol/week: 0.0 standard drinks    Comment: rare, light    Family History  Problem Relation Age of Onset  . Heart disease Father 77       MI  . Hypertension Father   . Bladder Cancer Father   . Heart disease Paternal Grandmother   . Heart attack Paternal Grandfather 81  . Colon cancer Neg Hx   . Stomach cancer Neg Hx   . Esophageal cancer Neg Hx   . Pancreatic cancer Neg Hx      Review of Systems  Constitutional: Negative for activity change, appetite change, chills, fatigue, fever and unexpected weight change.  HENT: Negative for congestion, ear pain, hearing loss, sinus pressure, sinus pain, sore throat and trouble swallowing.   Eyes: Negative for pain and visual disturbance.  Respiratory: Negative for cough, chest tightness, shortness of breath and wheezing.   Cardiovascular: Negative for chest pain, palpitations and leg swelling.  Gastrointestinal: Negative for abdominal pain, blood in stool, constipation, diarrhea, nausea and vomiting.  Genitourinary: Negative for difficulty urinating and menstrual problem.  Musculoskeletal: Negative for arthralgias and back pain.  Skin: Negative for rash.  Neurological: Negative for dizziness, weakness, numbness and headaches.  Hematological: Negative for adenopathy. Does not bruise/bleed easily.  Psychiatric/Behavioral: Negative for sleep disturbance and suicidal ideas. The patient is not nervous/anxious.     CBC:  Lab Results  Component Value Date   WBC 6.2 12/16/2018   HGB 13.6 12/16/2018   HCT 40.6 12/16/2018   MCHC 33.6 12/16/2018   RDW 12.9 12/16/2018   PLT 339.0 12/16/2018   CMP: Lab Results  Component Value Date   NA 138 12/16/2018   K 4.8 12/16/2018   CL 103 12/16/2018   CO2 30 12/16/2018   GLUCOSE 93 12/16/2018   BUN 15 12/16/2018   CREATININE 0.61 12/16/2018    CALCIUM 9.7 12/16/2018   PROT 6.6 12/16/2018   BILITOT 0.3 12/16/2018   ALKPHOS 46 12/16/2018   ALT 15 12/16/2018   AST 16 12/16/2018   LIPID: Lab Results  Component Value Date   CHOL 221 (H) 12/16/2018   TRIG 94 01/26/2016   HDL 48.60 12/16/2018   LDLCALC 138 01/26/2016    Objective:  BP 100/68 (BP Location: Left Arm, Patient Position: Sitting, Cuff Size: Normal)   Pulse 79   Temp 98.5 F (36.9 C) (Oral)   Ht 5\' 6"  (1.676 m)   Wt 148 lb 8 oz (67.4 kg)   LMP 12/06/2014   SpO2 98%   BMI 23.97 kg/m   Weight: 148 lb 8 oz (67.4 kg)   BP Readings from Last 3 Encounters:  05/10/21 100/68  12/30/19 120/80  01/30/19 124/76   Wt Readings from Last 3 Encounters:  05/10/21 148 lb 8 oz (67.4 kg)  02/03/20 142 lb (64.4 kg)  12/30/19 140 lb (63.5 kg)    Physical Exam Constitutional:      General: She is not in acute distress.    Appearance: She is well-developed.  HENT:     Head: Normocephalic and atraumatic.     Right Ear: External ear normal.     Left Ear: External ear normal.     Mouth/Throat:     Pharynx: No oropharyngeal exudate.  Eyes:     Conjunctiva/sclera: Conjunctivae normal.     Pupils: Pupils are equal, round, and reactive to light.  Neck:     Thyroid: No thyromegaly.  Cardiovascular:     Rate and Rhythm: Normal rate and regular rhythm.     Heart sounds: Normal heart sounds. No murmur heard. No friction rub. No gallop.   Pulmonary:     Effort: Pulmonary effort is normal.     Breath sounds: Normal breath sounds.  Abdominal:     General: Bowel sounds are normal. There is no distension.     Palpations: Abdomen is soft. There is no mass.     Tenderness: There is no abdominal tenderness. There is no guarding.     Hernia: No hernia is present.  Musculoskeletal:        General: No tenderness or deformity. Normal range of motion.     Cervical back: Normal range of motion and neck supple.  Lymphadenopathy:     Cervical: No cervical adenopathy.  Skin:     General: Skin is warm and dry.     Findings: No rash.  Neurological:     Mental Status: She is alert and oriented to person, place, and time.     Deep Tendon Reflexes: Reflexes normal.     Reflex Scores:      Tricep reflexes are 2+ on the right side and 2+ on the left side.      Bicep reflexes are 2+ on the right side and 2+ on the left side.      Brachioradialis reflexes are 2+ on the right side and 2+ on the left side.      Patellar reflexes are 2+ on the right side and 2+ on the left side. Psychiatric:        Speech: Speech normal.        Behavior: Behavior normal.        Thought Content: Thought content normal.     Assessment/Plan: Health Maintenance Due  Topic Date Due  . COVID-19 Vaccine (1) Never done  . Zoster Vaccines- Shingrix (1 of 2) Never done  . TETANUS/TDAP  12/10/2018  . PAP SMEAR-Modifier  05/10/2021   Health Maintenance reviewed - consider shingrix. She will update me with covid dates.  1. Preventative health care Keep up with active lifestyle and healthy eating.   2. Ulcerative colitis with complication, unspecified location (HCC) Sx have been stable. Follows with GI regularly.   3. Seasonal allergies Stable with current medications.  - montelukast (SINGULAIR) 10 MG tablet; Take 1 tablet (10 mg total) by mouth daily.  Dispense: 30 tablet; Refill: 1  4. Mild intermittent asthma without complication Breathing has been stable with current medications; rare albuterol use.  - montelukast (SINGULAIR) 10 MG tablet; Take 1 tablet (10 mg total) by mouth daily.  Dispense: 30 tablet; Refill: 1 - albuterol (VENTOLIN HFA) 108 (90 Base) MCG/ACT inhaler; INHALE 2 PUFFS INTO  THE LUNGS EVERY 6 HOURS AS NEEDED FOR WHEEZING OR SHORTNESS OF BREATH  Dispense: 8.5 g; Refill: 1  5. Palpitations EKG stable, sinus rhythm, no acute changes. Start with bloodwork and consider further eval for palpitations pending these results. - EKG 12-Lead - CBC with Differential/Platelet;  Future - Ferritin; Future - TSH; Future - T4, free; Future - T3, free; Future - Magnesium, RBC; Future  6. Anxiety and depression Mood has been stable. Prefers to stay on same dose for now.  - escitalopram (LEXAPRO) 20 MG tablet; Take 1 tablet (20 mg total) by mouth daily.  Dispense: 30 tablet; Refill: 1  7. Insomnia, unspecified type - zolpidem (AMBIEN) 5 MG tablet; Take 1 tablet (5 mg total) by mouth at bedtime as needed for sleep (about once a week for sleep).  Dispense: 30 tablet; Refill: 0  8. Hematuria, unspecified type - Urinalysis; Future - Urine Culture; Future - Comprehensive metabolic panel; Future  9. Elevated fasting glucose - Hemoglobin A1c; Future  10. Hyperlipidemia, unspecified hyperlipidemia type - Lipid panel; Future  Return for pending bloodwork results.  Theodis Shove, MD

## 2021-05-10 NOTE — Patient Instructions (Signed)
Zoster Vaccine, Recombinant injection What is this medicine? ZOSTER VACCINE (ZOS ter vak SEEN) is a vaccine used to reduce the risk of getting shingles. This vaccine is not used to treat shingles or nerve pain from shingles. This medicine may be used for other purposes; ask your health care provider or pharmacist if you have questions. COMMON BRAND NAME(S): SHINGRIX What should I tell my health care provider before I take this medicine? They need to know if you have any of these conditions:  cancer  immune system problems  an unusual or allergic reaction to Zoster vaccine, other medications, foods, dyes, or preservatives  pregnant or trying to get pregnant  breast-feeding How should I use this medicine? This vaccine is injected into a muscle. It is given by a health care provider. A copy of Vaccine Information Statements will be given before each vaccination. Be sure to read this information carefully each time. This sheet may change often. Talk to your health care provider about the use of this vaccine in children. This vaccine is not approved for use in children. Overdosage: If you think you have taken too much of this medicine contact a poison control center or emergency room at once. NOTE: This medicine is only for you. Do not share this medicine with others. What if I miss a dose? Keep appointments for follow-up (booster) doses. It is important not to miss your dose. Call your health care provider if you are unable to keep an appointment. What may interact with this medicine?  medicines that suppress your immune system  medicines to treat cancer  steroid medicines like prednisone or cortisone This list may not describe all possible interactions. Give your health care provider a list of all the medicines, herbs, non-prescription drugs, or dietary supplements you use. Also tell them if you smoke, drink alcohol, or use illegal drugs. Some items may interact with your medicine. What  should I watch for while using this medicine? Visit your health care provider regularly. This vaccine, like all vaccines, may not fully protect everyone. What side effects may I notice from receiving this medicine? Side effects that you should report to your doctor or health care professional as soon as possible:  allergic reactions (skin rash, itching or hives; swelling of the face, lips, or tongue)  trouble breathing Side effects that usually do not require medical attention (report these to your doctor or health care professional if they continue or are bothersome):  chills  headache  fever  nausea  pain, redness, or irritation at site where injected  tiredness  vomiting This list may not describe all possible side effects. Call your doctor for medical advice about side effects. You may report side effects to FDA at 1-800-FDA-1088. Where should I keep my medicine? This vaccine is only given by a health care provider. It will not be stored at home. NOTE: This sheet is a summary. It may not cover all possible information. If you have questions about this medicine, talk to your doctor, pharmacist, or health care provider.  2021 Elsevier/Gold Standard (2020-01-01 16:23:07)  

## 2021-05-11 ENCOUNTER — Encounter: Payer: Self-pay | Admitting: Family Medicine

## 2021-05-15 ENCOUNTER — Other Ambulatory Visit: Payer: Self-pay

## 2021-05-16 ENCOUNTER — Other Ambulatory Visit (INDEPENDENT_AMBULATORY_CARE_PROVIDER_SITE_OTHER): Payer: BLUE CROSS/BLUE SHIELD

## 2021-05-16 DIAGNOSIS — R7301 Impaired fasting glucose: Secondary | ICD-10-CM | POA: Diagnosis not present

## 2021-05-16 DIAGNOSIS — R002 Palpitations: Secondary | ICD-10-CM | POA: Diagnosis not present

## 2021-05-16 DIAGNOSIS — R319 Hematuria, unspecified: Secondary | ICD-10-CM

## 2021-05-16 DIAGNOSIS — E785 Hyperlipidemia, unspecified: Secondary | ICD-10-CM | POA: Diagnosis not present

## 2021-05-16 LAB — URINALYSIS, ROUTINE W REFLEX MICROSCOPIC
Bilirubin Urine: NEGATIVE
Ketones, ur: NEGATIVE
Nitrite: NEGATIVE
Specific Gravity, Urine: 1.015 (ref 1.000–1.030)
Total Protein, Urine: NEGATIVE
Urine Glucose: NEGATIVE
Urobilinogen, UA: 0.2 (ref 0.0–1.0)
pH: 7 (ref 5.0–8.0)

## 2021-05-16 LAB — CBC WITH DIFFERENTIAL/PLATELET
Basophils Absolute: 0 10*3/uL (ref 0.0–0.1)
Basophils Relative: 0.4 % (ref 0.0–3.0)
Eosinophils Absolute: 0 10*3/uL (ref 0.0–0.7)
Eosinophils Relative: 1.2 % (ref 0.0–5.0)
HCT: 39.2 % (ref 36.0–46.0)
Hemoglobin: 13.5 g/dL (ref 12.0–15.0)
Lymphocytes Relative: 36.1 % (ref 12.0–46.0)
Lymphs Abs: 1.5 10*3/uL (ref 0.7–4.0)
MCHC: 34.3 g/dL (ref 30.0–36.0)
MCV: 90 fl (ref 78.0–100.0)
Monocytes Absolute: 0.2 10*3/uL (ref 0.1–1.0)
Monocytes Relative: 5.2 % (ref 3.0–12.0)
Neutro Abs: 2.4 10*3/uL (ref 1.4–7.7)
Neutrophils Relative %: 57.1 % (ref 43.0–77.0)
Platelets: 308 10*3/uL (ref 150.0–400.0)
RBC: 4.35 Mil/uL (ref 3.87–5.11)
RDW: 13 % (ref 11.5–15.5)
WBC: 4.3 10*3/uL (ref 4.0–10.5)

## 2021-05-16 LAB — COMPREHENSIVE METABOLIC PANEL
ALT: 17 U/L (ref 0–35)
AST: 19 U/L (ref 0–37)
Albumin: 4.3 g/dL (ref 3.5–5.2)
Alkaline Phosphatase: 64 U/L (ref 39–117)
BUN: 14 mg/dL (ref 6–23)
CO2: 27 mEq/L (ref 19–32)
Calcium: 9.5 mg/dL (ref 8.4–10.5)
Chloride: 103 mEq/L (ref 96–112)
Creatinine, Ser: 0.62 mg/dL (ref 0.40–1.20)
GFR: 97.63 mL/min (ref >60.00)
Glucose, Bld: 101 mg/dL — ABNORMAL HIGH (ref 70–99)
Potassium: 4.9 mEq/L (ref 3.5–5.1)
Sodium: 140 mEq/L (ref 135–145)
Total Bilirubin: 0.4 mg/dL (ref 0.2–1.2)
Total Protein: 6.8 g/dL (ref 6.0–8.3)

## 2021-05-16 LAB — T3, FREE: T3, Free: 3.3 pg/mL (ref 2.3–4.2)

## 2021-05-16 LAB — LIPID PANEL
Cholesterol: 239 mg/dL — ABNORMAL HIGH (ref 0–200)
HDL: 47.4 mg/dL (ref >39.00)
LDL Cholesterol: 166 mg/dL — ABNORMAL HIGH (ref 0–99)
NonHDL: 191.48
Total CHOL/HDL Ratio: 5
Triglycerides: 128 mg/dL (ref 0.0–149.0)
VLDL: 25.6 mg/dL (ref 0.0–40.0)

## 2021-05-16 LAB — FERRITIN: Ferritin: 100.5 ng/mL (ref 10.0–291.0)

## 2021-05-16 LAB — T4, FREE: Free T4: 0.73 ng/dL (ref 0.60–1.60)

## 2021-05-16 LAB — HEMOGLOBIN A1C: Hgb A1c MFr Bld: 6.1 % (ref 4.6–6.5)

## 2021-05-16 LAB — TSH: TSH: 1.71 u[IU]/mL (ref 0.35–4.50)

## 2021-05-18 LAB — URINE CULTURE
MICRO NUMBER:: 11978148
Result:: NO GROWTH
SPECIMEN QUALITY:: ADEQUATE

## 2021-05-18 LAB — MAGNESIUM, RBC: Magnesium RBC: 5.4 mg/dL (ref 4.0–6.4)

## 2021-05-26 ENCOUNTER — Encounter: Payer: Self-pay | Admitting: Family Medicine

## 2021-07-19 LAB — HM PAP SMEAR: HM Pap smear: NEGATIVE

## 2021-07-24 ENCOUNTER — Other Ambulatory Visit: Payer: Self-pay | Admitting: Obstetrics and Gynecology

## 2021-07-24 DIAGNOSIS — Z8249 Family history of ischemic heart disease and other diseases of the circulatory system: Secondary | ICD-10-CM

## 2021-08-30 ENCOUNTER — Ambulatory Visit
Admission: RE | Admit: 2021-08-30 | Discharge: 2021-08-30 | Disposition: A | Discharge status: Home / Self Care | Payer: No Typology Code available for payment source | Source: Ambulatory Visit | Attending: Obstetrics and Gynecology | Admitting: Obstetrics and Gynecology

## 2021-08-30 DIAGNOSIS — Z8249 Family history of ischemic heart disease and other diseases of the circulatory system: Secondary | ICD-10-CM

## 2021-09-24 ENCOUNTER — Other Ambulatory Visit: Payer: Self-pay | Admitting: Family Medicine

## 2021-09-24 DIAGNOSIS — F32A Depression, unspecified: Secondary | ICD-10-CM

## 2021-09-24 DIAGNOSIS — J4520 Mild intermittent asthma, uncomplicated: Secondary | ICD-10-CM

## 2021-09-24 DIAGNOSIS — J302 Other seasonal allergic rhinitis: Secondary | ICD-10-CM

## 2021-09-24 DIAGNOSIS — F419 Anxiety disorder, unspecified: Secondary | ICD-10-CM

## 2021-10-10 ENCOUNTER — Encounter: Payer: Self-pay | Admitting: Family Medicine

## 2021-10-24 ENCOUNTER — Encounter (HOSPITAL_BASED_OUTPATIENT_CLINIC_OR_DEPARTMENT_OTHER): Payer: Self-pay | Admitting: Cardiology

## 2021-10-24 ENCOUNTER — Other Ambulatory Visit: Payer: Self-pay

## 2021-10-24 ENCOUNTER — Ambulatory Visit (INDEPENDENT_AMBULATORY_CARE_PROVIDER_SITE_OTHER): Payer: BLUE CROSS/BLUE SHIELD | Admitting: Cardiology

## 2021-10-24 VITALS — BP 118/80 | HR 68 | Ht 66.0 in | Wt 142.6 lb

## 2021-10-24 DIAGNOSIS — Z7189 Other specified counseling: Secondary | ICD-10-CM

## 2021-10-24 DIAGNOSIS — Z79899 Other long term (current) drug therapy: Secondary | ICD-10-CM

## 2021-10-24 DIAGNOSIS — Z8249 Family history of ischemic heart disease and other diseases of the circulatory system: Secondary | ICD-10-CM

## 2021-10-24 DIAGNOSIS — E7800 Pure hypercholesterolemia, unspecified: Secondary | ICD-10-CM | POA: Diagnosis not present

## 2021-10-24 DIAGNOSIS — I2510 Atherosclerotic heart disease of native coronary artery without angina pectoris: Secondary | ICD-10-CM

## 2021-10-24 DIAGNOSIS — Z23 Encounter for immunization: Secondary | ICD-10-CM

## 2021-10-24 MED ORDER — ROSUVASTATIN CALCIUM 10 MG PO TABS: 10.0000 mg | ORAL_TABLET | Freq: Every day | ORAL | 3 refills | Status: DC

## 2021-10-24 NOTE — Patient Instructions (Signed)
Medication Instructions:  START: Rosuvastatin 10 mg daily  *If you need a refill on your cardiac medications before your next appointment, please call your pharmacy*   Lab Work: Your provider has recommended lab work in 3 months (Fasting Lipid/ LFT). Please have this collected at [facility] at [place]. The lab is open 8:00 am - 4:30 pm. Please avoid 12:00p - 1:00p for lunch hour. You do not need an appointment. Please go to [id number] [place], [place] [address]. This is in the Primary   If you have labs (blood work) drawn today and your tests are completely normal, you will receive your results only by: MyChart Message (if you have MyChart) OR A paper copy in the mail If you have any lab test that is abnormal or we need to change your treatment, we will call you to review the results.   Testing/Procedures: None ordered today   Follow-Up: At [facility], you and your health needs are our priority.  As part of our continuing mission to provide you with exceptional heart care, we have created designated Provider Care Teams.  These Care Teams include your primary Cardiologist (physician) and Advanced Practice Providers (APPs -  Physician Assistants and Nurse Practitioners) who all work together to provide you with the care you need, when you need it.  We recommend signing up for the patient portal called "MyChart".  Sign up information is provided on this After Visit Summary.  MyChart is used to connect with patients for Virtual Visits (Telemedicine).  Patients are able to view lab/test results, encounter notes, upcoming appointments, etc.  Non-urgent messages can be sent to your provider as well.   To learn more about what you can do with MyChart, go to [email or web address].    Your next appointment:   1 year(s)  The format for your next appointment:   In Person  Provider:   Jodelle Red, MD

## 2021-10-24 NOTE — Progress Notes (Signed)
Cardiology Office Note:    Date:  10/24/2021   ID:  Suzanne Marshall, DOB 25-Jul-1961, MRN [id number]  PCP:  Caren Macadam, MD  Cardiologist:  Buford Dresser, MD  Referring MD: Arvella Nigh, MD   CC: new patient consultation for coronary artery calcification  History of Present Illness:    Suzanne Marshall is a 60 y.o. female with a hx of arrhythmia with pregnancy, asthma, family history of heart disease who is seen as a new consult at the request of Arvella Nigh, MD for the evaluation and management of coronary artery calcification.  Note from Dr. Radene Knee reviewed from 07/19/21, [facility]. Father with history of CABG, mother with history of MI. Had calcium score 08/30/21, CAC 24, 80th percentile. Referred to cardiology for further management.  Today she is accompanied by her husband.   Cardiovascular risk factors: Prior clinical ASCVD: None Comorbid conditions: borderline hyperlipidemia Family history: Her father had a heart attack at 53 yo. Paternal grandmother was in her 50's when she developed heart issues. Current diet: Has been following mediterranean diet for the past two weeks, less consistently prior.  At this time she is no longer having chest pains. She noticed that this occurred after receiving her Covid booster and went on constantly for several weeks. She would feel it in her central chest and other places, along with some back pain. She would describe her chest pain as mostly dull, but may occasionally be sharp. It is difficult for her to know if she had associated shortness of breath, as she often has asthma induced by seasonal allergies. If she was short of breath due to experiencing chest pain she notes it was not severe.  Also, she endorses having frequent arrhythmias and palpitations. Sometimes it will feel like a "flutter."  Last year in December she was infected with Covid, and noted having a fever.  She denies any lightheadedness,  headaches, syncope, orthopnea, PND, lower extremity edema or exertional symptoms.   Past Medical History:  Diagnosis Date   Allergic rhinitis    Anxiety and depression 12/17/2014   Arrhythmia 9 yrs ago   with pregnancy   Asthma    Cystitides, interstitial, chronic    Depression    History of irregular menstrual bleeding 12/17/2014   S/p eval with gyn, perimenopausal    Hot flashes 12/17/2014   Insomnia 12/17/2014   Plantar fascial fibromatosis    Ulcerative colitis ([place]) 12/17/2014   Managed by Dr. Wynetta Emery dx at 60 yrs old    Past Surgical History:  Procedure Laterality Date   COLONOSCOPY WITH PROPOFOL N/A 12/28/2014   Procedure: COLONOSCOPY WITH PROPOFOL;  Surgeon: Garlan Fair, MD;  Location: [facility];  Service: Endoscopy;  Laterality: N/A;   colonscopy  last done 7 yrs ago   x 4   DILATION AND CURETTAGE OF UTERUS  12 yrs ago   RHINOPLASTY  yrs ago    Current Medications: Current Outpatient Medications on File Prior to Visit  Medication Sig   acetaminophen (TYLENOL) 325 MG tablet Take 650 mg by mouth every 6 (six) hours as needed for moderate pain or headache.   albuterol (VENTOLIN HFA) 108 (90 Base) MCG/ACT inhaler INHALE 2 PUFFS INTO THE LUNGS EVERY 6 HOURS AS NEEDED FOR WHEEZING OR SHORTNESS OF BREATH   aspirin EC 81 MG tablet Take 81 mg by mouth daily. Swallow whole.   cetirizine (ZYRTEC) 10 MG tablet Take 5 mg by mouth at bedtime.   Cholecalciferol (VITAMIN D3 PO) Take 25  mcg by mouth daily.   conjugated estrogens (PREMARIN) vaginal cream Place 1 Applicatorful vaginally 2 (two) times a week.   escitalopram (LEXAPRO) 20 MG tablet TAKE 1 TABLET(20 MG) BY MOUTH DAILY   MAGNESIUM PO Take by mouth daily.   montelukast (SINGULAIR) 10 MG tablet TAKE 1 TABLET(10 MG) BY MOUTH DAILY   Multiple Vitamin (MULTIVITAMIN WITH MINERALS) TABS tablet Take 1 tablet by mouth daily.   triamcinolone (NASACORT) 55 MCG/ACT AERO nasal inhaler Place 2 sprays into the nose as needed.     zolpidem (AMBIEN) 5 MG tablet Take 1 tablet (5 mg total) by mouth at bedtime as needed for sleep (about once a week for sleep).   No current facility-administered medications on file prior to visit.     Allergies:   Ibuprofen and Sulfa antibiotics   Social History   Tobacco Use   Smoking status: Former    Types: Cigarettes   Smokeless tobacco: Never   Tobacco comments:    smoked a very little bit in highschool but she does not consider herself a regular smoker and hasn't smoked since  Vaping Use   Vaping Use: Never used  Substance Use Topics   Alcohol use: Yes    Alcohol/week: 0.0 standard drinks    Comment: rare, light    Drug use: No    Family History: family history includes Bladder Cancer in her father; Heart attack (age of onset: 29) in her paternal grandfather; Heart disease in her paternal grandmother; Heart disease (age of onset: 69) in her father; Hypertension in her father. There is no history of Colon cancer, Stomach cancer, Esophageal cancer, or Pancreatic cancer.  ROS:   Please see the history of present illness.  Additional pertinent ROS: Constitutional: Positive for seasonal allergies. Negative for chills, fever, night sweats, unintentional weight loss  HENT: Negative for ear pain and hearing loss.   Eyes: Negative for loss of vision and eye pain.  Respiratory: Positive for shortness of breath. Negative for cough, sputum, wheezing.   Cardiovascular: See HPI. Gastrointestinal: Negative for abdominal pain, melena, and hematochezia.  Genitourinary: Negative for dysuria and hematuria.  Musculoskeletal: Positive for back pain. Negative for falls and myalgias.  Skin: Negative for itching and rash.  Neurological: Negative for focal weakness, focal sensory changes and loss of consciousness.  Endo/Heme/Allergies: Positive for bruises easily. Does not bleed easily.     EKGs/Labs/Other Studies Reviewed:    The following studies were reviewed today:  Calcium Score  08/30/2021: FINDINGS: CORONARY CALCIUM SCORES:  Left Main: 0  LAD: 24  LCx: 0  RCA: 0    IMPRESSION: Total Agatston score: 24 [place] database percentile: 80 No acute or significant extracardiac abnormality.  EKG:  EKG is personally reviewed.   10/24/2021: NSR at 68 bpm  Recent Labs: 05/16/2021: ALT 17; BUN 14; Creatinine, Ser 0.62; Hemoglobin 13.5; Platelets 308.0; Potassium 4.9; Sodium 140; TSH 1.71   Recent Lipid Panel    Component Value Date/Time   CHOL 239 (H) 05/16/2021 0847   TRIG 128.0 05/16/2021 0847   HDL 47.40 05/16/2021 0847   CHOLHDL 5 05/16/2021 0847   VLDL 25.6 05/16/2021 0847   LDLCALC 166 (H) 05/16/2021 0847    Physical Exam:    VS:  BP 118/80 (BP Location: Left Arm, Patient Position: Sitting, Cuff Size: Normal)   Pulse 68   Ht 5\' 6"  (1.676 m)   Wt 142 lb 9.6 oz (64.7 kg)   LMP 12/06/2014   BMI 23.02 kg/m     Wt  Readings from Last 3 Encounters:  10/24/21 142 lb 9.6 oz (64.7 kg)  05/10/21 148 lb 8 oz (67.4 kg)  02/03/20 142 lb (64.4 kg)    GEN: Well nourished, well developed in no acute distress HEENT: Normal, moist mucous membranes NECK: No JVD CARDIAC: regular rhythm, normal S1 and S2, no rubs or gallops. No murmur. VASCULAR: Radial and DP pulses 2+ bilaterally. No carotid bruits RESPIRATORY:  Clear to auscultation without rales, wheezing or rhonchi  ABDOMEN: Soft, non-tender, non-distended MUSCULOSKELETAL:  Ambulates independently SKIN: Warm and dry, no edema NEUROLOGIC:  Alert and oriented x 3. No focal neuro deficits noted. PSYCHIATRIC:  Normal affect    ASSESSMENT:    1. Coronary artery calcification seen on CT scan   2. Nonocclusive coronary atherosclerosis of native coronary artery   3. Family history of heart disease   4. Pure hypercholesterolemia   5. Cardiac risk counseling   6. Counseling on health promotion and disease prevention   7. Medication management   8. Need for immunization against influenza    PLAN:    Coronary  artery calcification on CT, consistent with nonobstructive CAD Family history of heart disease Hypercholesterolemia -We reviewed the calcium score at length, including actual images as well as the graph showing mortality based on calcium score. We discussed the pathophysiology of cholesterol plaque formation, the role of calcium and why it is a marker, how plaque is key to acute MI/CVA, and how known plaque is managed with medications.   -we discussed the data on statins, both in terms of their long term benefit as well as the risk of side effects. Reviewed common misconceptions about statins. Reviewed how we monitor treatment. After shared decision making, patient is agreeable to trialing statin.  -also discussed role for aspirin, she is currently taking 81 mg aspirin and tolerating well -reviewed red flag warning signs that need immediate medical attention -LDL goal <70 -most recent lipids 05/16/21: Tchol 239, HDL 47, LDL 166, TG 128 -recheck lipids/lfts in 2-3 mos  Cardiac risk counseling and prevention recommendations: -recommend heart healthy/Mediterranean diet, with whole grains, fruits, vegetable, fish, lean meats, nuts, and olive oil. Limit salt. -recommend moderate walking, 3-5 times/week for 30-50 minutes each session. Aim for at least 150 minutes.week. Goal should be pace of 3 miles/hours, or walking 1.5 miles in 30 minutes -recommend avoidance of tobacco products. Avoid excess alcohol.  Flu shot administered today  Plan for follow up: 12 months or sooner as needed.  Buford Dresser, MD, PhD, [facility]    Medication Adjustments/Labs and Tests Ordered: Current medicines are reviewed at length with the patient today.  Concerns regarding medicines are outlined above.   Orders Placed This Encounter  Procedures   Flu Vaccine QUAD 4mo+IM (Fluarix, Fluzone & Alfiuria Quad PF)   Lipid panel   Hepatic function panel   EKG 12-Lead    Meds ordered this  encounter  Medications   rosuvastatin (CRESTOR) 10 MG tablet    Sig: Take 1 tablet (10 mg total) by mouth daily.    Dispense:  90 tablet    Refill:  3    Patient Instructions  Medication Instructions:  START: Rosuvastatin 10 mg daily  *If you need a refill on your cardiac medications before your next appointment, please call your pharmacy*   Lab Work: Your provider has recommended lab work in 3 months (Fasting Lipid/ LFT). Please have this collected at [facility] [place]. The lab is open 8:00 am -  4:30 pm. Please avoid 12:00p - 1:00p for lunch hour. You do not need an appointment. Please go to [address] [place], [place] [address]. This is in the Primary   If you have labs (blood work) drawn today and your tests are completely normal, you will receive your results only by: [place] (if you have MyChart) OR A paper copy in the mail If you have any lab test that is abnormal or we need to change your treatment, we will call you to review the results.   Testing/Procedures: None ordered today   Follow-Up: At [facility], you and your health needs are our priority.  As part of our continuing mission to provide you with exceptional heart care, we have created designated Provider Care Teams.  These Care Teams include your primary Cardiologist (physician) and Advanced Practice Providers (APPs -  Physician Assistants and Nurse Practitioners) who all work together to provide you with the care you need, when you need it.  We recommend signing up for the patient portal called "MyChart".  Sign up information is provided on this After Visit Summary.  MyChart is used to connect with patients for Virtual Visits (Telemedicine).  Patients are able to view lab/test results, encounter notes, upcoming appointments, etc.  Non-urgent messages can be sent to your provider as well.   To learn more about what you can do with MyChart, go to [email or web address].     Your next appointment:   1 year(s)  The format for your next appointment:   In Person  Provider:   Buford Dresser, MD       [facility],acting as a scribe for Buford Dresser, MD.,have documented all relevant documentation on the behalf of Buford Dresser, MD,as directed by  Buford Dresser, MD while in the presence of Buford Dresser, MD.  I, Buford Dresser, MD, have reviewed all documentation for this visit. The documentation on 10/24/21 for the exam, diagnosis, procedures, and orders are all accurate and complete.   Signed, Buford Dresser, MD PhD 10/24/2021 12:27 PM    [place]

## 2021-11-08 ENCOUNTER — Other Ambulatory Visit: Payer: Self-pay | Admitting: Family Medicine

## 2021-11-08 DIAGNOSIS — G4700 Insomnia, unspecified: Secondary | ICD-10-CM

## 2022-01-03 ENCOUNTER — Telehealth: Payer: BLUE CROSS/BLUE SHIELD | Admitting: Physician Assistant

## 2022-01-03 ENCOUNTER — Telehealth: Payer: BLUE CROSS/BLUE SHIELD | Admitting: Nurse Practitioner

## 2022-01-03 DIAGNOSIS — Z20822 Contact with and (suspected) exposure to covid-19: Secondary | ICD-10-CM

## 2022-01-03 DIAGNOSIS — J069 Acute upper respiratory infection, unspecified: Secondary | ICD-10-CM | POA: Diagnosis not present

## 2022-01-03 DIAGNOSIS — J4521 Mild intermittent asthma with (acute) exacerbation: Secondary | ICD-10-CM

## 2022-01-03 MED ORDER — BENZONATATE 100 MG PO CAPS: 100.0000 mg | ORAL_CAPSULE | Freq: Three times a day (TID) | ORAL | 0 refills | Status: DC | PRN

## 2022-01-03 MED ORDER — PREDNISONE 20 MG PO TABS: 20.0000 mg | ORAL_TABLET | Freq: Two times a day (BID) | ORAL | 0 refills | Status: AC

## 2022-01-03 MED ORDER — ALBUTEROL SULFATE HFA 108 (90 BASE) MCG/ACT IN AERS: 2.0000 | INHALATION_SPRAY | Freq: Four times a day (QID) | RESPIRATORY_TRACT | 0 refills | Status: DC | PRN

## 2022-01-03 NOTE — Progress Notes (Signed)
I have spent 5 minutes in review of e-visit questionnaire, review and updating patient chart, medical decision making and response to patient.   Maris Abascal Cody Raynee Mccasland, PA-C    

## 2022-01-03 NOTE — Progress Notes (Signed)
E-Visit for Corona Virus Screening  Your current symptoms could be consistent with the coronavirus. I am worried giving windedness and duration of fever that your initial COVID test was a false-negative. I would recommend repeating a home COVID test. If it remains negative then we can consider the possibility of this being a more significant influenza illness. Unfortunately you are out of the window for antiviral medications for the flu but you are still in the window to take medicine for COVID, which is another reason I want you to repeat a home test.   We are enrolling you in our MyChart Home Monitoring for COVID19 . Daily you will receive a questionnaire within the MyChart website. Our COVID 19 response team will be monitoring your responses daily.  Testing Information: The COVID-19 Community Testing sites are testing BY APPOINTMENT ONLY.  You can schedule online at [email or web address]  If you do not have access to a smart phone or computer you may call [phone] for an appointment.   Additional testing sites in the Community:  For [facility] Testing sites in [place]  FarmerBuys.[email or web address]  For Pop-up testing sites in [place]  [email or web address]  For [facility] [email or web address]  For [facility] testing in [place] and [employer] [email or web address]  For [facility] testing in [place]   [email or web address]  For  more information about community testing call [phone]   Please quarantine yourself while awaiting your test results. Please stay home for a minimum of 10 days from the first day of illness with improving symptoms and you have  had 24 hours of no fever (without the use of Tylenol (Acetaminophen) Motrin (Ibuprofen) or any fever reducing medication).  Also - Do not get tested prior to returning to work because once you have had a positive test the test can stay positive for more than a month in some cases.   You should wear a mask or cloth face covering over your nose and mouth if you must be around other people or animals, including pets (even at home). Try to stay at least 6 feet away from other people. This will protect the people around you.  Please continue good preventive care measures, including:  frequent hand-washing, avoid touching your face, cover coughs/sneezes, stay out of crowds and keep a 6 foot distance from others.  COVID-19 is a respiratory illness with symptoms that are similar to the flu. Symptoms are typically mild to moderate, but there have been cases of severe illness and death due to the virus.   The following symptoms may appear 2-14 days after exposure: Fever Cough Shortness of breath or difficulty breathing Chills Repeated shaking with chills Muscle pain Headache Sore throat New loss of taste or smell Fatigue Congestion or runny nose Nausea or vomiting Diarrhea  Go to the nearest hospital ED for assessment if fever/cough/breathlessness are severe or illness seems like a threat to life.  It is vitally important that if you feel that you have an infection such as this virus or any other virus that you stay home and away from places where you may spread it to others.  You should avoid contact with people age 91 and older.   You can use medication such as prescription cough medication called Tessalon Perles 100 mg. You may take 1-2 capsules every 8 hours as needed for cough  You may also take acetaminophen (Tylenol) as needed for fever.  Reduce your risk of any infection by using the same precautions used for avoiding the  common cold or flu:  Wash your hands often with soap and warm water for  at least 20 seconds.  If soap and water are not readily available, use an alcohol-based hand sanitizer with at least 60% alcohol.  If coughing or sneezing, cover your mouth and nose by coughing or sneezing into the elbow areas of your shirt or coat, into a tissue or into your sleeve (not your hands). Avoid shaking hands with others and consider head nods or verbal greetings only. Avoid touching your eyes, nose, or mouth with unwashed hands.  Avoid close contact with people who are sick. Avoid places or events with large numbers of people in one location, like concerts or sporting events. Carefully consider travel plans you have or are making. If you are planning any travel outside or inside the [place], visit the CDC's Travelers' Health webpage for the latest health notices. If you have some symptoms but not all symptoms, continue to monitor at home and seek medical attention if your symptoms worsen. If you are having a medical emergency, call 911.  HOME CARE Only take medications as instructed by your medical team. Drink plenty of fluids and get plenty of rest. A steam or ultrasonic humidifier can help if you have congestion.   GET HELP RIGHT AWAY IF YOU HAVE EMERGENCY WARNING SIGNS** FOR COVID-19. If you or someone is showing any of these signs seek emergency medical care immediately. Call 911 or proceed to your closest emergency facility if: You develop worsening high fever. Trouble breathing Bluish lips or face Persistent pain or pressure in the chest New confusion Inability to wake or stay awake You cough up blood. Your symptoms become more severe  **This list is not all possible symptoms. Contact your medical provider for any symptoms that are sever or concerning to you.  MAKE SURE YOU  Understand these instructions. Will watch your condition. Will get help right away if you are not doing well or get worse.  Your e-visit answers were reviewed by a board certified advanced clinical  practitioner to complete your personal care plan.  Depending on the condition, your plan could have included both over the counter or prescription medications.  If there is a problem please reply once you have received a response from your provider.  Your safety is important to [place].  If you have drug allergies check your prescription carefully.    You can use MyChart to ask questions about today's visit, request a non-urgent call back, or ask for a work or school excuse for 24 hours related to this e-Visit. If it has been greater than 24 hours you will need to follow up with your provider, or enter a new e-Visit to address those concerns. You will get an e-mail in the next two days asking about your experience.  I hope that your e-visit has been valuable and will speed your recovery. Thank you for using e-visits.

## 2022-01-03 NOTE — Progress Notes (Signed)
Virtual Visit Consent   Suzanne Marshall, you are scheduled for a virtual visit with a [facility] provider today.     Just as with appointments in the office, your consent must be obtained to participate.  Your consent will be active for this visit and any virtual visit you may have with one of our providers in the next 365 days.     If you have a MyChart account, a copy of this consent can be sent to you electronically.  All virtual visits are billed to your insurance company just like a traditional visit in the office.    As this is a virtual visit, video technology does not allow for your provider to perform a traditional examination.  This may limit your provider's ability to fully assess your condition.  If your provider identifies any concerns that need to be evaluated in person or the need to arrange testing (such as labs, EKG, etc.), we will make arrangements to do so.     Although advances in technology are sophisticated, we cannot ensure that it will always work on either your end or our end.  If the connection with a video visit is poor, the visit may have to be switched to a telephone visit.  With either a video or telephone visit, we are not always able to ensure that we have a secure connection.     I need to obtain your verbal consent now.   Are you willing to proceed with your visit today?    Ching Lawrenz has provided verbal consent on 01/03/2022 for a virtual visit (video or telephone).   Apolonio Schneiders, FNP   Date: 01/03/2022 5:14 PM   Virtual Visit via Video Note   I, Apolonio Schneiders, connected with  Patsi Haeussler  ([id number], Feb 07, 1961) on 01/03/22 at  5:15 PM EST by a video-enabled telemedicine application and verified that I am speaking with the correct person using two identifiers.  Location: Patient: Virtual Visit Location Patient: Home Provider: Virtual Visit Location Provider: Home Office   I discussed the limitations of evaluation and management by  telemedicine and the availability of in person appointments. The patient expressed understanding and agreed to proceed.    History of Present Illness: Suzanne Marshall is a 61 y.o. who identifies as a female who was assigned female at birth, and is being seen today for fever up to 102 for the past few days. She has taken Advil OTC for relief. Today is day three of feeling ill. She has body aches and a cough.    She has ear pressure and congestion.  She has also been taking an over the counter decongestant as well.   She has taken two COVID tests that are negative.   She has a dry cough that feels like an asthma flair. She does not use a daily inhaler but has been using her Albuterol inhaler as needed and uses Singulair also.    She has had a flu vaccine this year.   Problems:  Patient Active Problem List   Diagnosis Date Noted   Snoring 01/30/2019   Hot flashes 12/17/2014   Anxiety and depression 12/17/2014   Insomnia 12/17/2014   History of irregular menstrual bleeding 12/17/2014   Ulcerative colitis ([place]) 123456   Uncomplicated asthma 123456    Allergies:  Allergies  Allergen Reactions   Ibuprofen Other (See Comments)    GI upset per patient   Sulfa Antibiotics Rash    And high fever   Medications:  Current Outpatient Medications:    acetaminophen (TYLENOL) 325 MG tablet, Take 650 mg by mouth every 6 (six) hours as needed for moderate pain or headache., Disp: , Rfl:    albuterol (VENTOLIN HFA) 108 (90 Base) MCG/ACT inhaler, INHALE 2 PUFFS INTO THE LUNGS EVERY 6 HOURS AS NEEDED FOR WHEEZING OR SHORTNESS OF BREATH, Disp: 8.5 g, Rfl: 1   aspirin EC 81 MG tablet, Take 81 mg by mouth daily. Swallow whole., Disp: , Rfl:    benzonatate (TESSALON) 100 MG capsule, Take 1 capsule (100 mg total) by mouth 3 (three) times daily as needed for cough., Disp: 30 capsule, Rfl: 0   cetirizine (ZYRTEC) 10 MG tablet, Take 5 mg by mouth at bedtime., Disp: , Rfl:    Cholecalciferol  (VITAMIN D3 PO), Take 25 mcg by mouth daily., Disp: , Rfl:    conjugated estrogens (PREMARIN) vaginal cream, Place 1 Applicatorful vaginally 2 (two) times a week., Disp: 42.5 g, Rfl: 12   escitalopram (LEXAPRO) 20 MG tablet, TAKE 1 TABLET(20 MG) BY MOUTH DAILY, Disp: 90 tablet, Rfl: 1   montelukast (SINGULAIR) 10 MG tablet, TAKE 1 TABLET(10 MG) BY MOUTH DAILY, Disp: 90 tablet, Rfl: 1   Multiple Vitamin (MULTIVITAMIN WITH MINERALS) TABS tablet, Take 1 tablet by mouth daily., Disp: , Rfl:    rosuvastatin (CRESTOR) 10 MG tablet, Take 1 tablet (10 mg total) by mouth daily., Disp: 90 tablet, Rfl: 3   triamcinolone (NASACORT) 55 MCG/ACT AERO nasal inhaler, Place 2 sprays into the nose as needed. , Disp: , Rfl:    zolpidem (AMBIEN) 5 MG tablet, TAKE 1 TABLET BY MOUTH AT BEDTIME AS NEEDED FOR SLEEP(ABOUT ONCE A WEEK FOR SLEEP), Disp: 90 tablet, Rfl: 1  Observations/Objective: Patient is well-developed, well-nourished in no acute distress.  Resting comfortably at home.  Head is normocephalic, atraumatic.  No labored breathing. Speech is clear and coherent with logical content.  Patient is alert and oriented at baseline.    Assessment and Plan: 1. Viral upper respiratory tract infection 2. Mild intermittent asthma with acute exacerbation  - predniSONE (DELTASONE) 20 MG tablet; Take 1 tablet (20 mg total) by mouth 2 (two) times daily with a meal for 5 days.  Dispense: 10 tablet; Refill: 0 - albuterol (VENTOLIN HFA) 108 (90 Base) MCG/ACT inhaler; Inhale 2 puffs into the lungs every 6 (six) hours as needed for wheezing or shortness of breath.  Dispense: 8 g; Refill: 0     Follow Up Instructions: I discussed the assessment and treatment plan with the patient. The patient was provided an opportunity to ask questions and all were answered. The patient agreed with the plan and demonstrated an understanding of the instructions.  A copy of instructions were sent to the patient via MyChart unless otherwise  noted below.   The patient was advised to call back or seek an in-person evaluation if the symptoms worsen or if the condition fails to improve as anticipated.  Time:  I spent minutes with the patient via telehealth technology discussing the above problems/concerns.    Apolonio Schneiders, FNP

## 2022-01-08 ENCOUNTER — Telehealth: Payer: BLUE CROSS/BLUE SHIELD | Admitting: Family

## 2022-01-08 DIAGNOSIS — R051 Acute cough: Secondary | ICD-10-CM

## 2022-01-08 DIAGNOSIS — H6690 Otitis media, unspecified, unspecified ear: Secondary | ICD-10-CM

## 2022-01-08 MED ORDER — AMOXICILLIN-POT CLAVULANATE 875-125 MG PO TABS: 1.0000 | ORAL_TABLET | Freq: Two times a day (BID) | ORAL | 0 refills | Status: DC

## 2022-01-08 NOTE — Progress Notes (Signed)

## 2022-01-09 ENCOUNTER — Encounter (HOSPITAL_BASED_OUTPATIENT_CLINIC_OR_DEPARTMENT_OTHER): Payer: Self-pay

## 2022-01-15 ENCOUNTER — Encounter: Payer: Self-pay | Admitting: Family Medicine

## 2022-01-16 ENCOUNTER — Telehealth: Payer: BLUE CROSS/BLUE SHIELD | Admitting: Nurse Practitioner

## 2022-01-16 DIAGNOSIS — J4520 Mild intermittent asthma, uncomplicated: Secondary | ICD-10-CM | POA: Diagnosis not present

## 2022-01-16 DIAGNOSIS — J069 Acute upper respiratory infection, unspecified: Secondary | ICD-10-CM

## 2022-01-16 DIAGNOSIS — J4521 Mild intermittent asthma with (acute) exacerbation: Secondary | ICD-10-CM | POA: Diagnosis not present

## 2022-01-16 MED ORDER — ALBUTEROL SULFATE HFA 108 (90 BASE) MCG/ACT IN AERS: INHALATION_SPRAY | RESPIRATORY_TRACT | 1 refills | Status: AC

## 2022-01-16 MED ORDER — PREDNISONE 20 MG PO TABS: 40.0000 mg | ORAL_TABLET | Freq: Every day | ORAL | 0 refills | Status: AC

## 2022-01-16 NOTE — Progress Notes (Signed)
Visit for Asthma  Based on what you have shared with me, it looks like you may have a flare up of your asthma.  Asthma is a chronic (ongoing) lung disease which results in airway obstruction, inflammation and hyper-responsiveness.   Asthma symptoms vary from person to person, with common symptoms including nighttime awakening and decreased ability to participate in normal activities as a result of shortness of breath. It is often triggered by changes in weather, changes in the season, changes in air temperature, or inside (home, school, daycare or work) allergens such as animal dander, mold, mildew, woodstoves or cockroaches.   It can also be triggered by hormonal changes, extreme emotion, physical exertion or an upper respiratory tract illness.     It is important to identify the trigger, and then eliminate or avoid the trigger if possible.   If you have been prescribed medications to be taken on a regular basis, it is important to follow the asthma action plan and to follow guidelines to adjust medication in response to increasing symptoms of decreased peak expiratory flow rate  Treatment: I have prescribed: Albuterol (Proventil HFA; Ventolin HFA) 108 (90 Base) MCG/ACT Inhaler 2 puffs into the lungs every six hours as needed for wheezing or shortness of breath and Prednisone 40mg by mouth per day for 5 - 7 days  HOME CARE . Only take medications as instructed by your medical team. . Consider wearing a mask or scarf to improve breathing air temperature have been shown to decrease irritation and decrease exacerbations . Get rest. . Taking a steamy shower or using a humidifier may help nasal congestion sand ease sore throat pain. You can place a towel over your head and breathe in the steam from hot water coming from a faucet. . Using a saline nasal spray works much the same way.  . Cough  drops, hare candies and sore throat lozenges may ease your cough.  . Avoid close contacts especially the very you and the elderly . Cover your mouth if you cough or sneeze . Always remember to wash your hands.    GET HELP RIGHT AWAY IF: . You develop worsening symptoms; breathlessness at rest, drowsy, confused or agitated, unable to speak in full sentences . You have coughing fits . You develop a severe headache or visual changes . You develop shortness of breath, difficulty breathing or start having chest pain . Your symptoms persist after you have completed your treatment plan . If your symptoms do not improve within 10 days  MAKE SURE YOU . Understand these instructions. . Will watch your condition. . Will get help right away if you are not doing well or get worse.   Your e-visit answers were reviewed by a board certified advanced clinical practitioner to complete your personal care plan, Depending upon the condition, your plan could have included both over the counter or prescription medications.  Please review your pharmacy choice. Your safety is important to us. If you have drug allergies check your prescription carefully. You can use MyChart to ask questions about today's visit, request a non-urgent call back, or ask for a work or school excuse for 24 hours related to this e-Visit. If it has been greater than 24 hours you will need to follow up with your provider, or enter a new e-Visit to address those concerns.  You will get an e-mail in the next two days asking about your experience. I hope that your e-visit has been valuable and will speed your recovery.   Thank you for using e-visits.  5-10 minutes spent reviewing and documenting in chart.  

## 2022-04-03 IMAGING — MG DIGITAL DIAGNOSTIC BILAT W/ TOMO W/ CAD
8 series · 9 of 24 positions shown · non-contrast
Comparison: Previous exam(s).

CLINICAL DATA: Delayed follow-up of a probable benign cyst in the
[DATE] region of the left breast seen on diagnostic exam dated
05/13/2018.

EXAM:
DIGITAL DIAGNOSTIC BILATERAL MAMMOGRAM WITH TOMOSYNTHESIS AND CAD;
ULTRASOUND LEFT BREAST LIMITED
TECHNIQUE: Bilateral digital diagnostic mammography and breast tomosynthesis
was performed. The images were evaluated with computer-aided
detection.; Targeted ultrasound examination of the left breast was
performed

[R CC synth-2D]
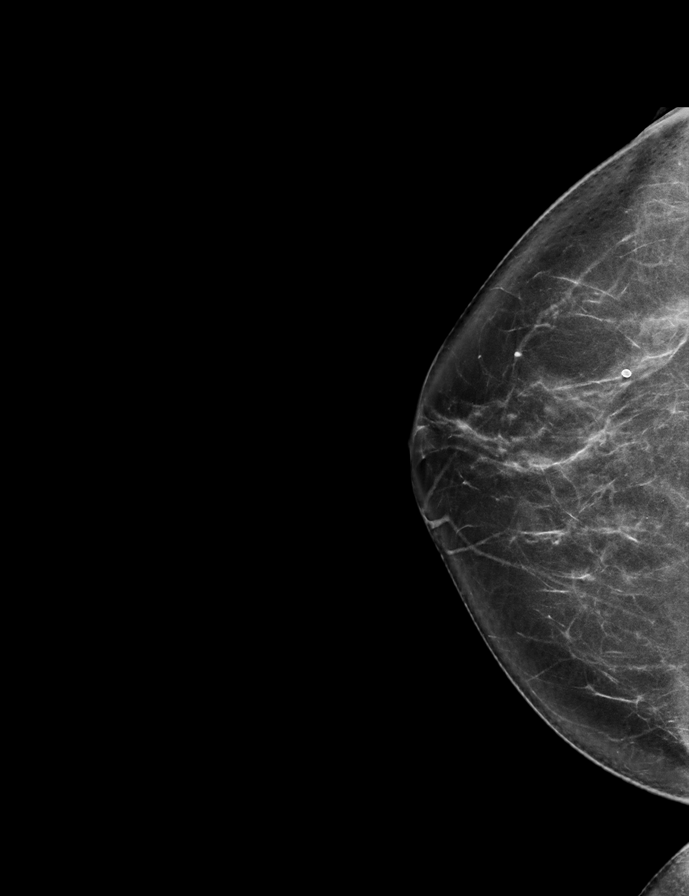

[L MLO synth-2D]
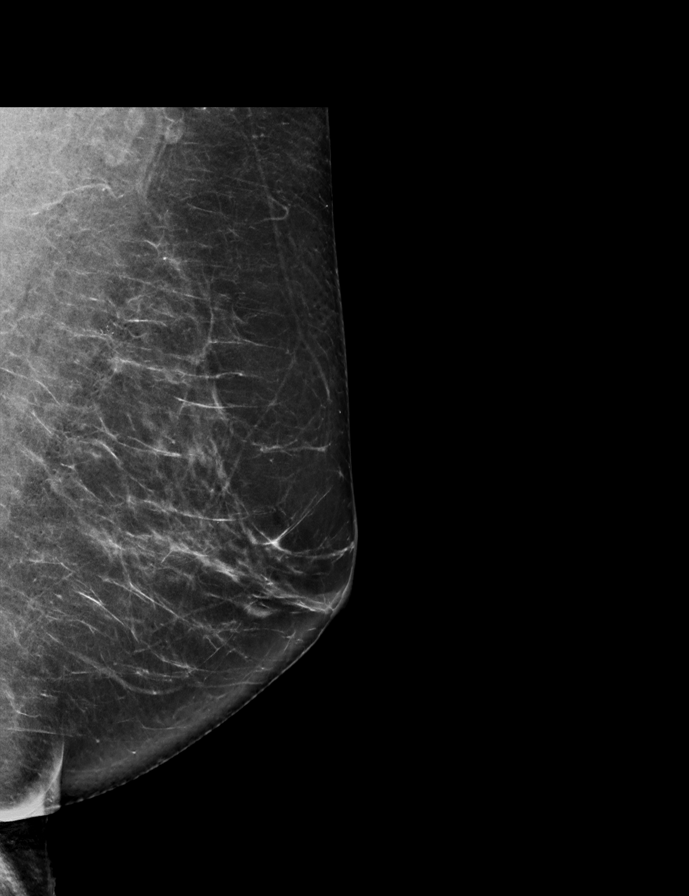

[R MLO synth-2D]
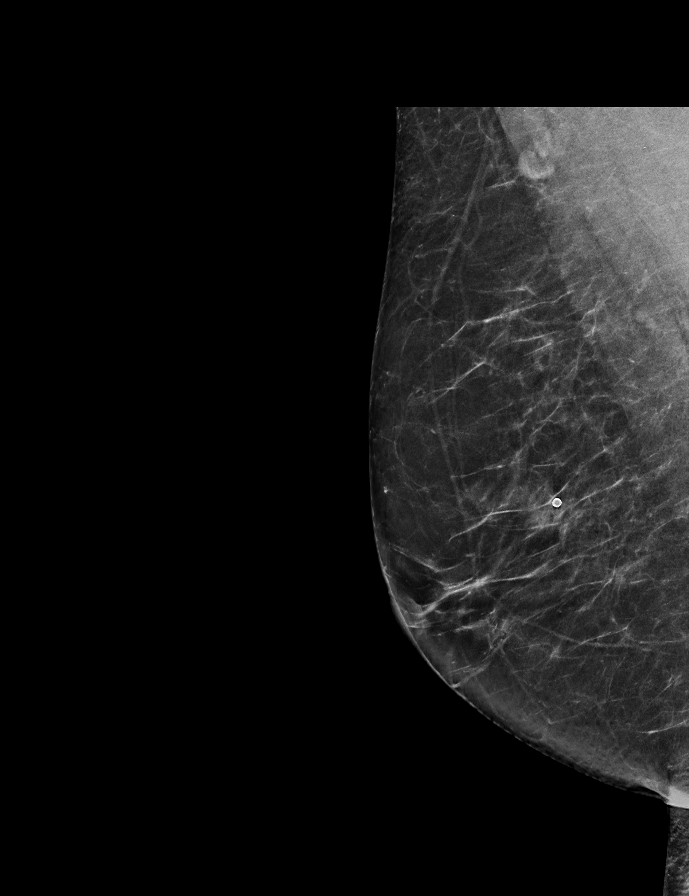

[L CC synth-2D]
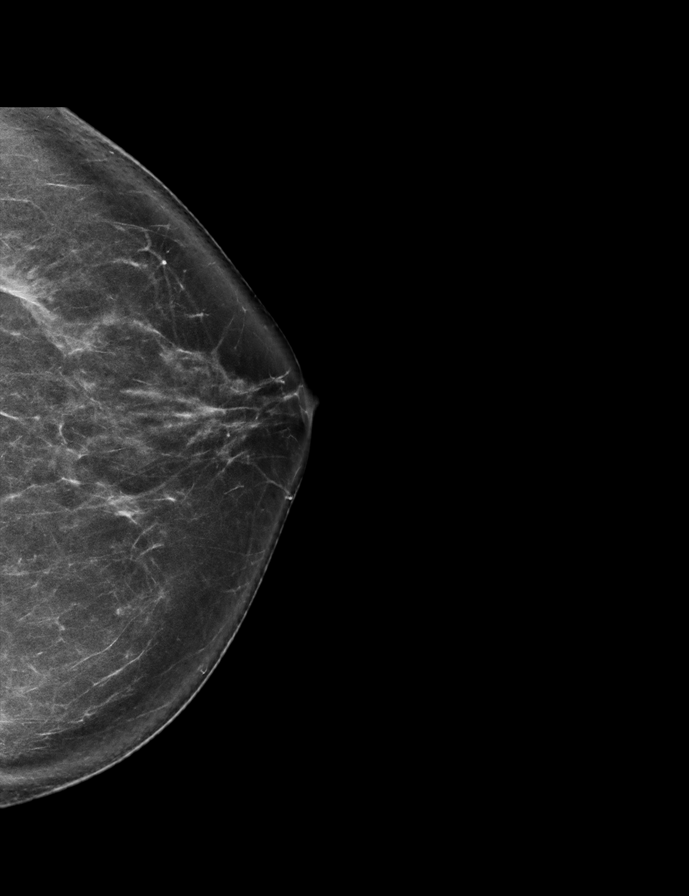

[L MLO tomo · 2 of 77 frames shown]
[frame 25/77]
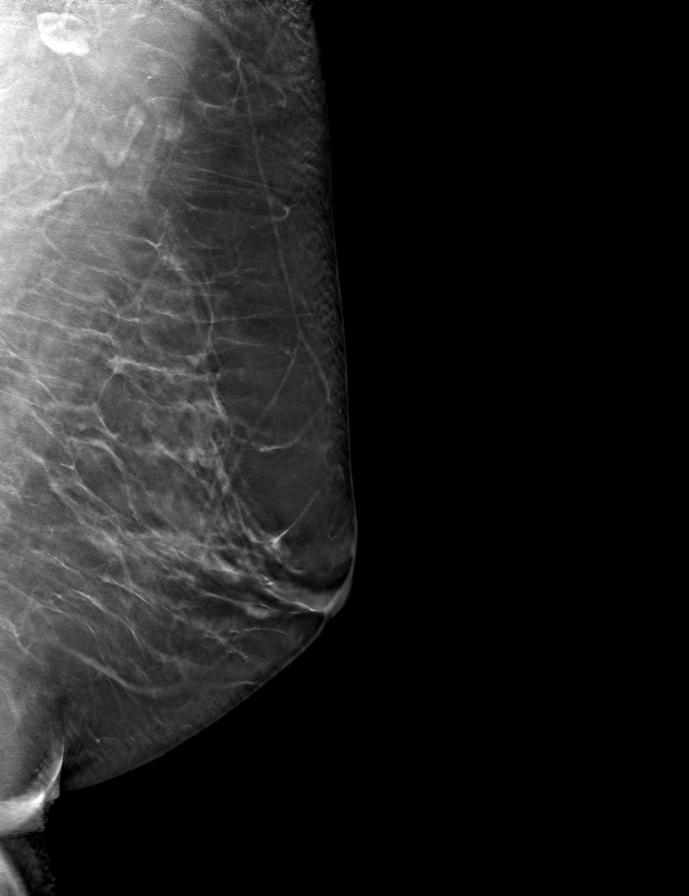
[frame 39/77]
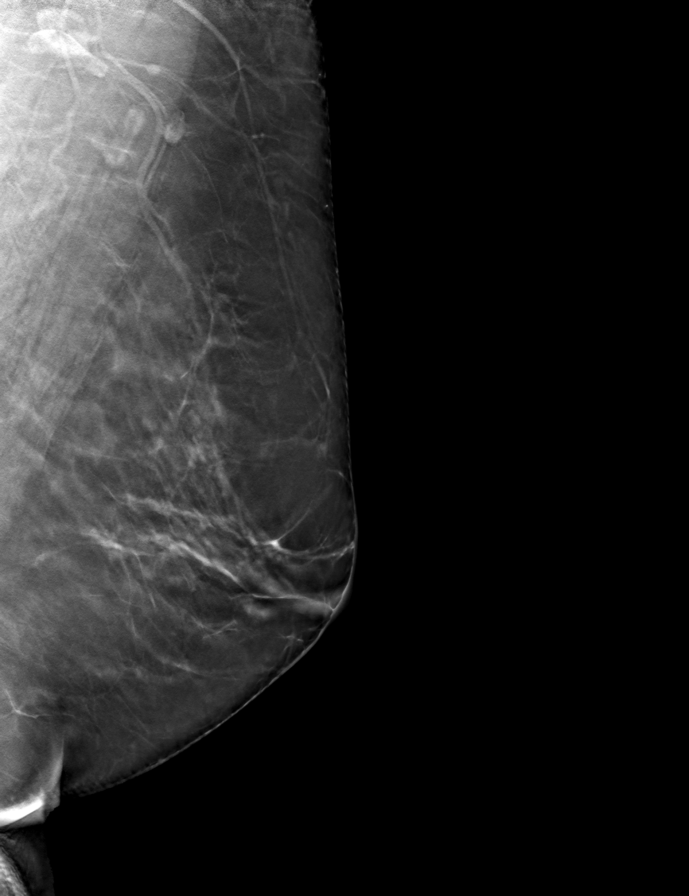

[R MLO tomo · tomo slice 35/68.0]
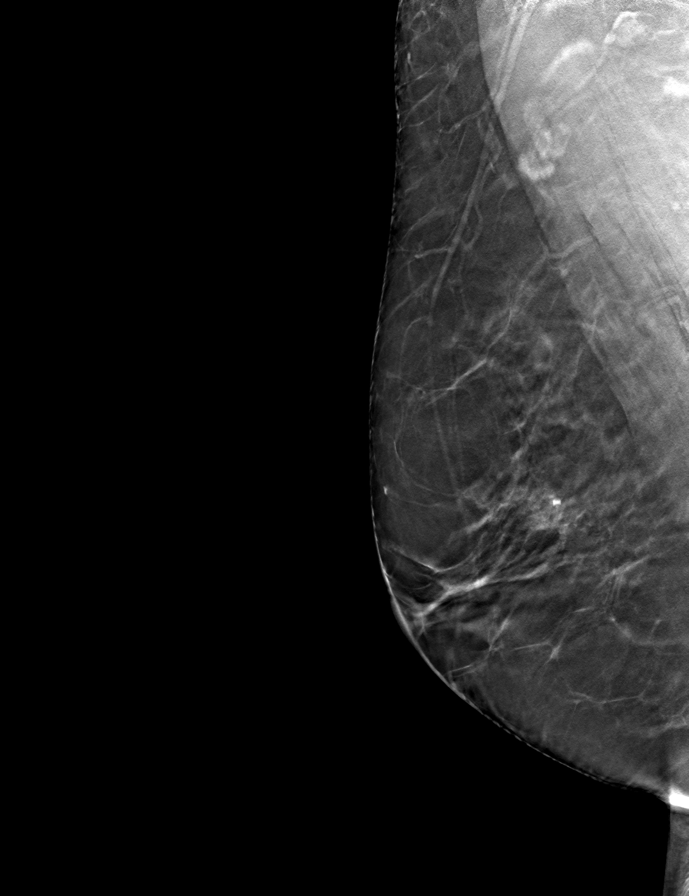

[L CC tomo · tomo slice 36/71.0]
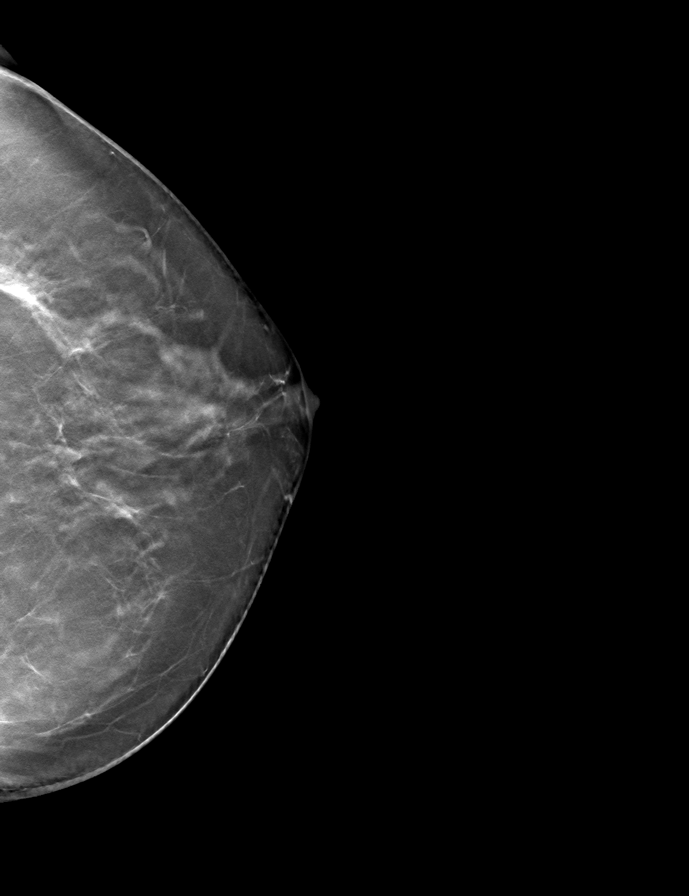

[R CC tomo · tomo slice 35/70.0]
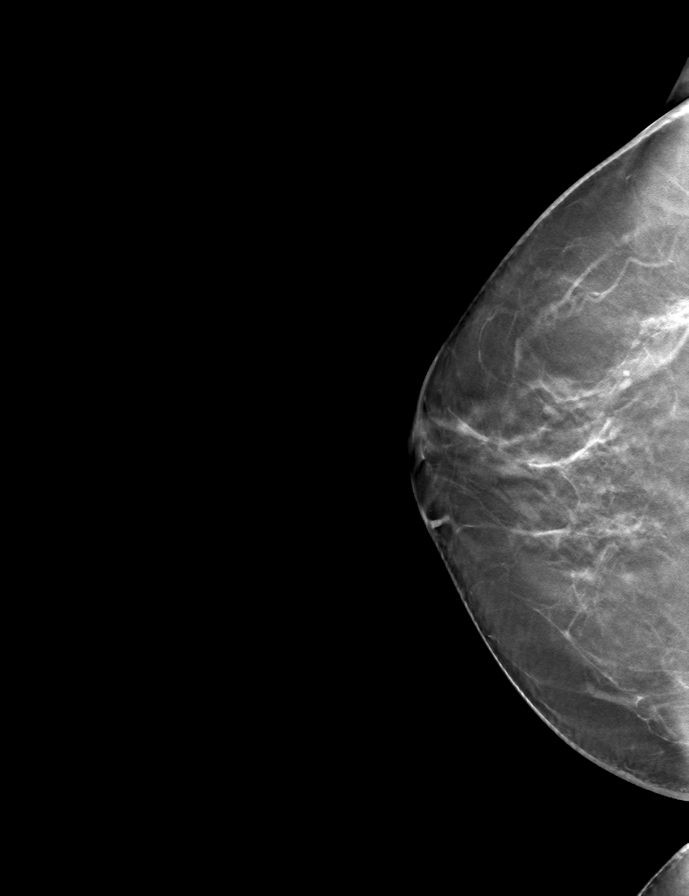

[9 of 24 positions shown; findings below may reference images not displayed]

ACR Breast Density Category b: There are scattered areas of
fibroglandular density.
FINDINGS: No suspicious mass or malignant type microcalcifications identified
in either breast.

Targeted ultrasound is performed, showing normal tissue in the [DATE]
region of the left breast. The previously seen complicated cyst is
no longer visualized.
IMPRESSION: No evidence of malignancy in either breast.

RECOMMENDATION:
Bilateral screening mammogram in 1 year is recommended.

I have discussed the findings and recommendations with the patient.
If applicable, a reminder letter will be sent to the patient
regarding the next appointment.

BI-RADS CATEGORY  1: Negative.

## 2022-04-03 IMAGING — US US BREAST*L* LIMITED INC AXILLA
1 series · 2 of 2 positions shown · non-contrast
Comparison: Previous exam(s).

CLINICAL DATA: Delayed follow-up of a probable benign cyst in the
[DATE] region of the left breast seen on diagnostic exam dated
05/13/2018.

EXAM:
DIGITAL DIAGNOSTIC BILATERAL MAMMOGRAM WITH TOMOSYNTHESIS AND CAD;
ULTRASOUND LEFT BREAST LIMITED
TECHNIQUE: Bilateral digital diagnostic mammography and breast tomosynthesis
was performed. The images were evaluated with computer-aided
detection.; Targeted ultrasound examination of the left breast was
performed

[Series 1: us breast*left* limited inc axilla · 0.06mm/px · 2 of 2 slices shown]
[im 1/2]
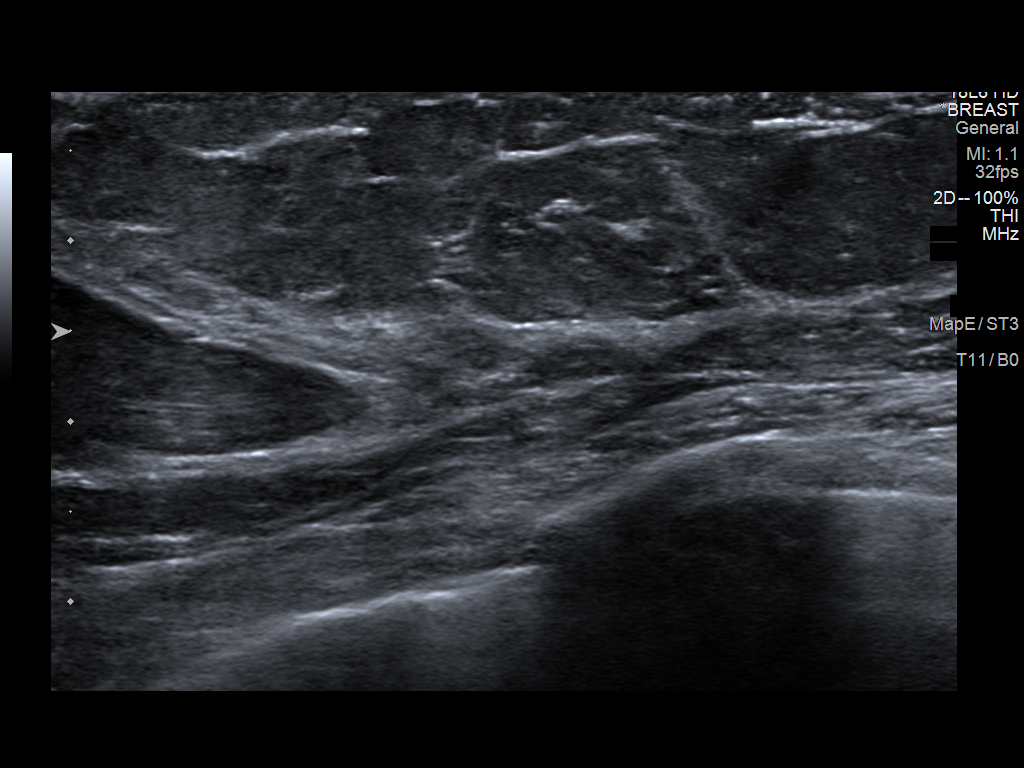
[im 2/2]
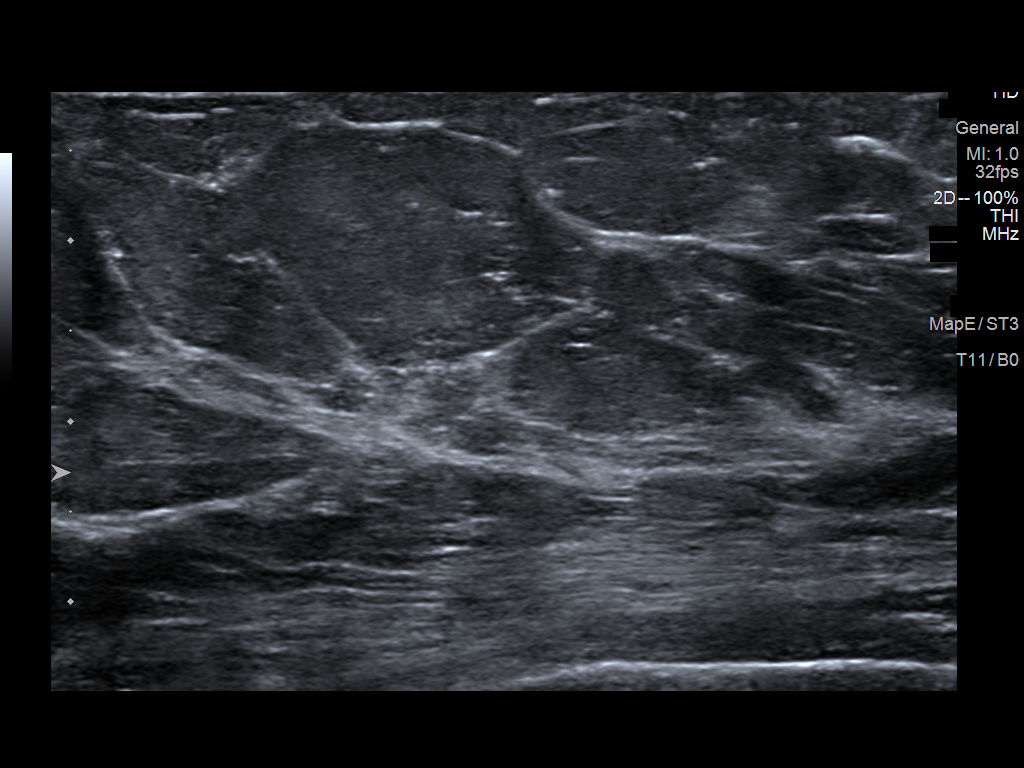

[2 of 2 positions shown; findings below may reference images not displayed]

ACR Breast Density Category b: There are scattered areas of
fibroglandular density.
FINDINGS: No suspicious mass or malignant type microcalcifications identified
in either breast.

Targeted ultrasound is performed, showing normal tissue in the [DATE]
region of the left breast. The previously seen complicated cyst is
no longer visualized.
IMPRESSION: No evidence of malignancy in either breast.

RECOMMENDATION:
Bilateral screening mammogram in 1 year is recommended.

I have discussed the findings and recommendations with the patient.
If applicable, a reminder letter will be sent to the patient
regarding the next appointment.

BI-RADS CATEGORY  1: Negative.

## 2022-06-14 ENCOUNTER — Other Ambulatory Visit: Payer: Self-pay | Admitting: Nurse Practitioner

## 2022-06-18 ENCOUNTER — Other Ambulatory Visit: Payer: Self-pay | Admitting: *Deleted

## 2022-06-18 DIAGNOSIS — J302 Other seasonal allergic rhinitis: Secondary | ICD-10-CM

## 2022-06-18 DIAGNOSIS — F32A Depression, unspecified: Secondary | ICD-10-CM

## 2022-06-18 DIAGNOSIS — J4520 Mild intermittent asthma, uncomplicated: Secondary | ICD-10-CM

## 2022-06-18 MED ORDER — MONTELUKAST SODIUM 10 MG PO TABS: ORAL_TABLET | ORAL | 0 refills | Status: DC

## 2022-06-18 MED ORDER — ESCITALOPRAM OXALATE 20 MG PO TABS: ORAL_TABLET | ORAL | 0 refills | Status: DC

## 2022-06-26 ENCOUNTER — Telehealth: Payer: Self-pay | Admitting: Family Medicine

## 2022-06-26 NOTE — Telephone Encounter (Signed)
Error/njr °

## 2022-07-23 ENCOUNTER — Encounter: Payer: Self-pay | Admitting: Family Medicine

## 2022-07-23 ENCOUNTER — Ambulatory Visit: Payer: No Typology Code available for payment source | Admitting: Family Medicine

## 2022-07-23 DIAGNOSIS — E785 Hyperlipidemia, unspecified: Secondary | ICD-10-CM | POA: Insufficient documentation

## 2022-07-23 DIAGNOSIS — F419 Anxiety disorder, unspecified: Secondary | ICD-10-CM

## 2022-07-23 DIAGNOSIS — R7303 Prediabetes: Secondary | ICD-10-CM | POA: Diagnosis not present

## 2022-07-23 DIAGNOSIS — F32A Depression, unspecified: Secondary | ICD-10-CM

## 2022-07-23 DIAGNOSIS — G4700 Insomnia, unspecified: Secondary | ICD-10-CM

## 2022-07-23 DIAGNOSIS — J4520 Mild intermittent asthma, uncomplicated: Secondary | ICD-10-CM

## 2022-07-23 DIAGNOSIS — E782 Mixed hyperlipidemia: Secondary | ICD-10-CM

## 2022-07-23 DIAGNOSIS — J302 Other seasonal allergic rhinitis: Secondary | ICD-10-CM

## 2022-07-23 LAB — POCT GLYCOSYLATED HEMOGLOBIN (HGB A1C): Hemoglobin A1C: 5.6 % (ref 4.0–5.6)

## 2022-07-23 MED ORDER — ZOLPIDEM TARTRATE 5 MG PO TABS: 5.0000 mg | ORAL_TABLET | Freq: Every evening | ORAL | 0 refills | Status: DC | PRN

## 2022-07-23 MED ORDER — MONTELUKAST SODIUM 10 MG PO TABS: ORAL_TABLET | ORAL | 1 refills | Status: DC

## 2022-07-23 MED ORDER — ESCITALOPRAM OXALATE 20 MG PO TABS: ORAL_TABLET | ORAL | 1 refills | Status: DC

## 2022-07-23 NOTE — Assessment & Plan Note (Signed)
Lungs clear on exam today, she needed refills on her singulair 10 mg daily, states she only gets flare ups with viral illness and allergy flares.

## 2022-07-23 NOTE — Assessment & Plan Note (Signed)
Elevated LDL on last labs, she had adverse reaction to crestor in the past, if her 10 year CVD risk is elevated then we might try a lower potency statin... will recheck lipid panel today.

## 2022-07-23 NOTE — Progress Notes (Signed)
Established Patient Office Visit  Subjective   Patient ID: Suzanne Marshall, female    DOB: May 26, 1961  Age: 61 y.o. MRN: [id number]  Chief Complaint  Patient presents with   Establish Care    Patient is here to re-establish care. She reports no new symptoms or issues.  Pre-DM-- we discussed her last A1C which was 6.1, patient states she has been reducing sugar in her diet and increasing exercise, states she has lost weight since her last visit here. A1C was performed today in office and is 5.6 which is much improved. We discussed using an app on her phone to track her carb intake.   Anxiety/depression-- PHQ was reviewed with patient, she reports she is using the ambien to help her sleep about once a week. Is also on lexapro 20 mg daily and this is controlling her symptoms well.   HLD- she is due for her annual labs today, LDL last year was 166, pt reports she is increasing exercise. She is not currently on any medication for this problem. States she did try taking a statin once and she developed severe back pain and had to stop the medication.      Review of Systems  All other systems reviewed and are negative.     Objective:     BP 102/70 (BP Location: Left Arm, Patient Position: Sitting, Cuff Size: Normal)   Pulse 65   Temp 98.5 F (36.9 C) (Oral)   Ht 5\' 6"  (1.676 m)   Wt 138 lb 4.8 oz (62.7 kg)   LMP 12/06/2014   SpO2 99%   BMI 22.32 kg/m    Physical Exam Vitals reviewed.  Constitutional:      Appearance: Normal appearance. She is well-groomed and normal weight.  HENT:     Head: Normocephalic and atraumatic.  Eyes:     Extraocular Movements: Extraocular movements intact.     Pupils: Pupils are equal, round, and reactive to light.  Cardiovascular:     Rate and Rhythm: Normal rate and regular rhythm.     Pulses: Normal pulses.     Heart sounds: S1 normal and S2 normal.  Pulmonary:     Effort: Pulmonary effort is normal.     Breath sounds: Normal breath  sounds and air entry.  Abdominal:     General: Abdomen is flat. Bowel sounds are normal.     Palpations: Abdomen is soft.  Musculoskeletal:        General: Normal range of motion.     Cervical back: Normal range of motion and neck supple.     Right lower leg: No edema.     Left lower leg: No edema.  Skin:    General: Skin is warm and dry.  Neurological:     Mental Status: She is alert and oriented to person, place, and time. Mental status is at baseline.     Gait: Gait is intact.  Psychiatric:        Mood and Affect: Mood and affect normal.        Speech: Speech normal.        Behavior: Behavior normal.        Judgment: Judgment normal.      Results for orders placed or performed in visit on 07/23/22  POC HgB A1c  Result Value Ref Range   Hemoglobin A1C 5.6 4.0 - 5.6 %   HbA1c POC (<> result, manual entry)     HbA1c, POC (prediabetic range)     HbA1c,  POC (controlled diabetic range)        The 10-year ASCVD risk score (Arnett DK, et al., 2019) is: 2.7%    Assessment & Plan:   Problem List Items Addressed This Visit       Respiratory   Uncomplicated asthma    Lungs clear on exam today, she needed refills on her singulair 10 mg daily, states she only gets flare ups with viral illness and allergy flares.       Relevant Medications   montelukast (SINGULAIR) 10 MG tablet     Other   Anxiety and depression    On lexapro 20 mg daily, well controlled symptoms,  Flowsheet Row Office Visit from 07/23/2022 in [facility] [place]  PHQ-9 Total Score 3           Relevant Medications   escitalopram (LEXAPRO) 20 MG tablet   Insomnia   Relevant Medications   zolpidem (AMBIEN) 5 MG tablet   Prediabetes (Chronic)    A1C today was reviewed with patient, 5.6 which is much improved. I encouraged her to continue her dietary changes and exercise regimen, RTC in 6 months for recheck.      Relevant Orders   POC HgB A1c (Completed)   CMP   Hyperlipidemia  (Chronic)    Elevated LDL on last labs, she had adverse reaction to crestor in the past, if her 10 year CVD risk is elevated then we might try a lower potency statin... will recheck lipid panel today.      Relevant Orders   Lipid Panel   Other Visit Diagnoses     Seasonal allergies       Relevant Medications   montelukast (SINGULAIR) 10 MG tablet       Return in about 6 months (around 01/23/2023) for follow up A1C and refills.    Karie Georges, MD

## 2022-07-23 NOTE — Assessment & Plan Note (Signed)
A1C today was reviewed with patient, 5.6 which is much improved. I encouraged her to continue her dietary changes and exercise regimen, RTC in 6 months for recheck.

## 2022-07-23 NOTE — Assessment & Plan Note (Signed)
On lexapro 20 mg daily, well controlled symptoms,  Flowsheet Row Office Visit from 07/23/2022 in [facility] [place]  PHQ-9 Total Score 3

## 2022-08-01 ENCOUNTER — Other Ambulatory Visit (INDEPENDENT_AMBULATORY_CARE_PROVIDER_SITE_OTHER): Payer: No Typology Code available for payment source

## 2022-08-01 DIAGNOSIS — R7303 Prediabetes: Secondary | ICD-10-CM

## 2022-08-01 DIAGNOSIS — E782 Mixed hyperlipidemia: Secondary | ICD-10-CM

## 2022-08-01 LAB — COMPREHENSIVE METABOLIC PANEL
ALT: 14 U/L (ref 0–35)
AST: 17 U/L (ref 0–37)
Albumin: 4.1 g/dL (ref 3.5–5.2)
Alkaline Phosphatase: 51 U/L (ref 39–117)
BUN: 15 mg/dL (ref 6–23)
CO2: 30 mEq/L (ref 19–32)
Calcium: 9.1 mg/dL (ref 8.4–10.5)
Chloride: 107 mEq/L (ref 96–112)
Creatinine, Ser: 0.62 mg/dL (ref 0.40–1.20)
GFR: 96.8 mL/min (ref >60.00)
Glucose, Bld: 101 mg/dL — ABNORMAL HIGH (ref 70–99)
Potassium: 4.2 mEq/L (ref 3.5–5.1)
Sodium: 138 mEq/L (ref 135–145)
Total Bilirubin: 0.5 mg/dL (ref 0.2–1.2)
Total Protein: 6.7 g/dL (ref 6.0–8.3)

## 2022-08-01 LAB — LIPID PANEL
Cholesterol: 215 mg/dL — ABNORMAL HIGH (ref 0–200)
HDL: 49 mg/dL (ref >39.00)
LDL Cholesterol: 146 mg/dL — ABNORMAL HIGH (ref 0–99)
NonHDL: 166.16
Total CHOL/HDL Ratio: 4
Triglycerides: 100 mg/dL (ref 0.0–149.0)
VLDL: 20 mg/dL (ref 0.0–40.0)

## 2022-09-24 ENCOUNTER — Encounter: Payer: Self-pay | Admitting: Family Medicine

## 2022-09-24 DIAGNOSIS — E782 Mixed hyperlipidemia: Secondary | ICD-10-CM

## 2022-09-25 MED ORDER — PRAVASTATIN SODIUM 20 MG PO TABS: 20.0000 mg | ORAL_TABLET | Freq: Every day | ORAL | 1 refills | Status: DC

## 2022-11-27 ENCOUNTER — Institutional Professional Consult (permissible substitution): Payer: No Typology Code available for payment source | Admitting: Internal Medicine

## 2022-12-07 ENCOUNTER — Other Ambulatory Visit: Payer: Self-pay | Admitting: Family Medicine

## 2022-12-07 DIAGNOSIS — G4700 Insomnia, unspecified: Secondary | ICD-10-CM

## 2022-12-10 NOTE — Telephone Encounter (Signed)
Last OV 07/23/22 Last refill per [facility] 07/23/22 Next OV: none scheduled

## 2022-12-11 NOTE — Telephone Encounter (Signed)
She will need an appointment in February for her 6 month follow up--  only sent in 30 days of medication

## 2022-12-18 ENCOUNTER — Encounter (HOSPITAL_BASED_OUTPATIENT_CLINIC_OR_DEPARTMENT_OTHER): Payer: Self-pay | Admitting: Cardiology

## 2022-12-18 ENCOUNTER — Ambulatory Visit (INDEPENDENT_AMBULATORY_CARE_PROVIDER_SITE_OTHER): Payer: No Typology Code available for payment source | Admitting: Cardiology

## 2022-12-18 VITALS — BP 114/68 | HR 69 | Ht 66.0 in | Wt 144.4 lb

## 2022-12-18 DIAGNOSIS — E7800 Pure hypercholesterolemia, unspecified: Secondary | ICD-10-CM

## 2022-12-18 DIAGNOSIS — Z789 Other specified health status: Secondary | ICD-10-CM

## 2022-12-18 DIAGNOSIS — I2510 Atherosclerotic heart disease of native coronary artery without angina pectoris: Secondary | ICD-10-CM | POA: Diagnosis not present

## 2022-12-18 DIAGNOSIS — Z8249 Family history of ischemic heart disease and other diseases of the circulatory system: Secondary | ICD-10-CM

## 2022-12-18 DIAGNOSIS — Z7189 Other specified counseling: Secondary | ICD-10-CM

## 2022-12-18 NOTE — Patient Instructions (Signed)
Medication Instructions:  Your Physician recommend you continue on your current medication as directed.    *If you need a refill on your cardiac medications before your next appointment, please call your pharmacy*  Follow-Up: At [facility], you and your health needs are our priority.  As part of our continuing mission to provide you with exceptional heart care, we have created designated Provider Care Teams.  These Care Teams include your primary Cardiologist (physician) and Advanced Practice Providers (APPs -  Physician Assistants and Nurse Practitioners) who all work together to provide you with the care you need, when you need it.  We recommend signing up for the patient portal called "MyChart".  Sign up information is provided on this After Visit Summary.  MyChart is used to connect with patients for Virtual Visits (Telemedicine).  Patients are able to view lab/test results, encounter notes, upcoming appointments, etc.  Non-urgent messages can be sent to your provider as well.   To learn more about what you can do with MyChart, go to [email or web address].    Your next appointment:   1 year(s)  The format for your next appointment:   In Person  Provider:   Bridgette Christopher, MD  

## 2022-12-18 NOTE — Progress Notes (Signed)
Cardiology Office Note:    Date:  12/21/2022   ID:  Suzanne Marshall, DOB Nov 16, 1961, MRN [id number]  PCP:  Farrel Conners, MD  Cardiologist:  Buford Dresser, MD  Referring MD: Farrel Conners, MD   CC: follow up  History of Present Illness:    Suzanne Marshall is a 62 y.o. female with a hx of arrhythmia with pregnancy, asthma, family history of heart disease who is seen for follow up today. I initially met her 10/24/21 as a new consult at the request of Farrel Conners, MD for the evaluation and management of coronary artery calcification.  Cardiac history: Father with history of CABG, mother with history of MI. Had calcium score 08/30/21, CAC 24, 80th percentile.   Today, the patient states that she has been good recently. She has been having ringing in her ears as a reaction to pravastatin. Had ringing in her ears and back pain with rosuvastatin. We discussed statin alternatives at length, see below.  She denies any palpitations, chest pain, shortness of breath, or peripheral edema. No lightheadedness, headaches, syncope, orthopnea, or PND.  Past Medical History:  Diagnosis Date   Allergic rhinitis    Anxiety and depression 12/17/2014   Arrhythmia 9 yrs ago   with pregnancy   Asthma    Cystitides, interstitial, chronic    Depression    History of irregular menstrual bleeding 12/17/2014   S/p eval with gyn, perimenopausal    Hot flashes 12/17/2014   Insomnia 12/17/2014   Plantar fascial fibromatosis    Ulcerative colitis ([facility]) 12/17/2014   Managed by Dr. Wynetta Emery dx at 62 yrs old    Past Surgical History:  Procedure Laterality Date   COLONOSCOPY WITH PROPOFOL N/A 12/28/2014   Procedure: COLONOSCOPY WITH PROPOFOL;  Surgeon: Garlan Fair, MD;  Location: [facility];  Service: Endoscopy;  Laterality: N/A;   colonscopy  last done 7 yrs ago   x 4   DILATION AND CURETTAGE OF UTERUS  12 yrs ago   RHINOPLASTY  yrs ago    Current Medications: Current  Outpatient Medications on File Prior to Visit  Medication Sig   acetaminophen (TYLENOL) 325 MG tablet Take 650 mg by mouth every 6 (six) hours as needed for moderate pain or headache.   albuterol (VENTOLIN HFA) 108 (90 Base) MCG/ACT inhaler INHALE 2 PUFFS INTO THE LUNGS EVERY 6 HOURS AS NEEDED FOR WHEEZING OR SHORTNESS OF BREATH   aspirin EC 81 MG tablet Take 81 mg by mouth daily. Swallow whole.   cetirizine (ZYRTEC) 10 MG tablet Take 5 mg by mouth at bedtime.   Cholecalciferol (VITAMIN D3 PO) Take 25 mcg by mouth daily.   escitalopram (LEXAPRO) 20 MG tablet TAKE 1 TABLET(20 MG) BY MOUTH DAILY   montelukast (SINGULAIR) 10 MG tablet TAKE 1 TABLET(10 MG) BY MOUTH DAILY   Multiple Vitamin (MULTIVITAMIN WITH MINERALS) TABS tablet Take 1 tablet by mouth daily.   triamcinolone (NASACORT) 55 MCG/ACT AERO nasal inhaler Place 2 sprays into the nose as needed.    zolpidem (AMBIEN) 5 MG tablet TAKE 1 TABLET(5 MG) BY MOUTH AT BEDTIME AS NEEDED FOR SLEEP   No current facility-administered medications on file prior to visit.     Allergies:   Ibuprofen and Sulfa antibiotics   Social History   Tobacco Use   Smoking status: Former    Types: Cigarettes   Smokeless tobacco: Never   Tobacco comments:    smoked a very little bit in highschool but she does not consider  herself a regular smoker and hasn't smoked since  Vaping Use   Vaping Use: Never used  Substance Use Topics   Alcohol use: Yes    Alcohol/week: 0.0 standard drinks of alcohol    Comment: rare, light    Drug use: No    Family History: family history includes Bladder Cancer in her father; Heart attack (age of onset: 53) in her paternal grandfather; Heart disease in her paternal grandmother; Heart disease (age of onset: 69) in her father; Hypertension in her father. There is no history of Colon cancer, Stomach cancer, Esophageal cancer, or Pancreatic cancer.  ROS:   Please see the history of present illness.    Additional pertinent ROS  otherwise unremarkable  EKGs/Labs/Other Studies Reviewed:    The following studies were reviewed today:  Calcium Score 08/30/2021: FINDINGS: CORONARY CALCIUM SCORES:  Left Main: 0  LAD: 24  LCx: 0  RCA: 0    IMPRESSION: Total Agatston score: 24 [place] database percentile: 80 No acute or significant extracardiac abnormality.  EKG:  EKG is personally reviewed.   12/18/2022: NSR at 69 bpm 10/24/2021: NSR at 68 bpm  Recent Labs: 08/01/2022: ALT 14; BUN 15; Creatinine, Ser 0.62; Potassium 4.2; Sodium 138   Recent Lipid Panel    Component Value Date/Time   CHOL 215 (H) 08/01/2022 0904   TRIG 100.0 08/01/2022 0904   HDL 49.00 08/01/2022 0904   CHOLHDL 4 08/01/2022 0904   VLDL 20.0 08/01/2022 0904   LDLCALC 146 (H) 08/01/2022 0904    Physical Exam:    VS:  BP 114/68 (BP Location: Left Arm, Patient Position: Sitting, Cuff Size: Normal)   Pulse 69   Ht 5\' 6"  (1.676 m)   Wt 144 lb 6.4 oz (65.5 kg)   LMP 12/06/2014   BMI 23.31 kg/m     Wt Readings from Last 3 Encounters:  12/18/22 144 lb 6.4 oz (65.5 kg)  07/23/22 138 lb 4.8 oz (62.7 kg)  10/24/21 142 lb 9.6 oz (64.7 kg)    GEN: Well nourished, well developed in no acute distress HEENT: Normal, moist mucous membranes NECK: No JVD CARDIAC: regular rhythm, normal S1 and S2, no rubs or gallops. No murmur. VASCULAR: Radial and DP pulses 2+ bilaterally. No carotid bruits RESPIRATORY:  Clear to auscultation without rales, wheezing or rhonchi  ABDOMEN: Soft, non-tender, non-distended MUSCULOSKELETAL:  Ambulates independently SKIN: Warm and dry, no edema NEUROLOGIC:  Alert and oriented x 3. No focal neuro deficits noted. PSYCHIATRIC:  Normal affect    ASSESSMENT:    1. Pure hypercholesterolemia   2. Statin intolerance   3. Coronary artery calcification seen on CT scan   4. Nonocclusive coronary atherosclerosis of native coronary artery   5. Family history of heart disease   6. Cardiac risk counseling   7. Counseling on  health promotion and disease prevention     PLAN:    Coronary artery calcification on CT, consistent with nonobstructive CAD Family history of heart disease Hypercholesterolemia -attempted both rosuvastatin and pravastatin, did not tolerate due to tinnitus/back pain. Today we discussed alternatives, including another statin, nexletol, and pcks9i. She declines for now, but we will continue to readdress -currently taking 81 mg aspirin and tolerating well -reviewed red flag warning signs that need immediate medical attention -LDL goal <70 -most recent lipids 08/01/22: Tchol 215, HDL 49, LDL 146, TG 100  Cardiac risk counseling and prevention recommendations: -recommend heart healthy/Mediterranean diet, with whole grains, fruits, vegetable, fish, lean meats, nuts, and olive oil. Limit salt. -  recommend moderate walking, 3-5 times/week for 30-50 minutes each session. Aim for at least 150 minutes.week. Goal should be pace of 3 miles/hours, or walking 1.5 miles in 30 minutes -recommend avoidance of tobacco products. Avoid excess alcohol.  Plan for follow up: 1 year  Jodelle Red, MD, PhD, [facility] [place]  [facility]    Medication Adjustments/Labs and Tests Ordered: Current medicines are reviewed at length with the patient today.  Concerns regarding medicines are outlined above.   Orders Placed This Encounter  Procedures   EKG 12-Lead    No orders of the defined types were placed in this encounter.   Patient Instructions  Medication Instructions:  Your Physician recommend you continue on your current medication as directed.    *If you need a refill on your cardiac medications before your next appointment, please call your pharmacy*  Follow-Up: At [facility], you and your health needs are our priority.  As part of our continuing mission to provide you with exceptional heart care, we have created designated Provider Care Teams.  These Care Teams include your  primary Cardiologist (physician) and Advanced Practice Providers (APPs -  Physician Assistants and Nurse Practitioners) who all work together to provide you with the care you need, when you need it.  We recommend signing up for the patient portal called "MyChart".  Sign up information is provided on this After Visit Summary.  MyChart is used to connect with patients for Virtual Visits (Telemedicine).  Patients are able to view lab/test results, encounter notes, upcoming appointments, etc.  Non-urgent messages can be sent to your provider as well.   To learn more about what you can do with MyChart, go to [email or web address].    Your next appointment:   1 year(s)  The format for your next appointment:   In Person  Provider:   Jodelle Red, MD      I,Coren O'Brien,acting as a scribe for Jodelle Red, MD.,have documented all relevant documentation on the behalf of Jodelle Red, MD,as directed by  Jodelle Red, MD while in the presence of Jodelle Red, MD.  I, Jodelle Red, MD, have reviewed all documentation for this visit. The documentation on 12/21/22 for the exam, diagnosis, procedures, and orders are all accurate and complete.   Signed, Jodelle Red, MD PhD 12/21/2022 11:10 AM    [facility]

## 2022-12-21 NOTE — Progress Notes (Unsigned)
12/24/22- 62 yoF former high school smoker for sleep evaluation to re-establish. Medical problem list includes Asthma, Allergic Rhinitis, Ulcerative Colitis, Interstitial Cystitis, Anxiety and Depression, CAD,  Had seen Dr Elsworth Soho once in 2020 but didn't follow through. Covid era disrupted plans. -Ambien 5, Ventolin hfa, Singulair, Zyrtec,  Epworth score-9 Body weight today-144 lbs Covid vax-2 Phizer Flu vax- History of loud snoring, daytime tiredness. Evaluated for paroxysmal AFib. Parental hx of osa. ENT surgery for septoplasty. Ambien about 1x/ week.  Prior to Admission medications   Medication Sig Start Date End Date Taking? Authorizing Provider  acetaminophen (TYLENOL) 325 MG tablet Take 650 mg by mouth every 6 (six) hours as needed for moderate pain or headache.   Yes [provider]  albuterol (VENTOLIN HFA) 108 (90 Base) MCG/ACT inhaler INHALE 2 PUFFS INTO THE LUNGS EVERY 6 HOURS AS NEEDED FOR WHEEZING OR SHORTNESS OF BREATH 01/16/22  Yes Hassell Done, Mary-Margaret, FNP  aspirin EC 81 MG tablet Take 81 mg by mouth daily. Swallow whole.   Yes [provider]  cetirizine (ZYRTEC) 10 MG tablet Take 5 mg by mouth at bedtime.   Yes [provider]  Cholecalciferol (VITAMIN D3 PO) Take 25 mcg by mouth daily.   Yes [provider]  escitalopram (LEXAPRO) 20 MG tablet TAKE 1 TABLET(20 MG) BY MOUTH DAILY 07/23/22  Yes Farrel Conners, MD  montelukast (SINGULAIR) 10 MG tablet TAKE 1 TABLET(10 MG) BY MOUTH DAILY 07/23/22  Yes Farrel Conners, MD  Multiple Vitamin (MULTIVITAMIN WITH MINERALS) TABS tablet Take 1 tablet by mouth daily.   Yes [provider]  triamcinolone (NASACORT) 55 MCG/ACT AERO nasal inhaler Place 2 sprays into the nose as needed.    Yes [provider]  zolpidem (AMBIEN) 5 MG tablet TAKE 1 TABLET(5 MG) BY MOUTH AT BEDTIME AS NEEDED FOR SLEEP 12/11/22  Yes Farrel Conners, MD   Past Medical History:  Diagnosis Date   Allergic  rhinitis    Anxiety and depression 12/17/2014   Arrhythmia 9 yrs ago   with pregnancy   Asthma    Cystitides, interstitial, chronic    Depression    History of irregular menstrual bleeding 12/17/2014   S/p eval with gyn, perimenopausal    Hot flashes 12/17/2014   Insomnia 12/17/2014   Plantar fascial fibromatosis    Ulcerative colitis ([place]) 12/17/2014   Managed by Dr. Wynetta Emery dx at 62 yrs old   Past Surgical History:  Procedure Laterality Date   COLONOSCOPY WITH PROPOFOL N/A 12/28/2014   Procedure: COLONOSCOPY WITH PROPOFOL;  Surgeon: Garlan Fair, MD;  Location: [facility];  Service: Endoscopy;  Laterality: N/A;   colonscopy  last done 7 yrs ago   x 4   DILATION AND CURETTAGE OF UTERUS  12 yrs ago   RHINOPLASTY  yrs ago   Family History  Problem Relation Age of Onset   Heart disease Father 78       MI   Hypertension Father    Bladder Cancer Father    Heart disease Paternal Grandmother    Heart attack Paternal Grandfather 5   Colon cancer Neg Hx    Stomach cancer Neg Hx    Esophageal cancer Neg Hx    Pancreatic cancer Neg Hx    Social History   Socioeconomic History   Marital status: Married    Spouse name: Not on file   Number of children: Not on file   Years of education: Not on file   Highest education  level: Not on file  Occupational History   Not on file  Tobacco Use   Smoking status: Former    Types: Cigarettes   Smokeless tobacco: Never   Tobacco comments:    smoked a very little bit in highschool but she does not consider herself a regular smoker and hasn't smoked since  Vaping Use   Vaping Use: Never used  Substance and Sexual Activity   Alcohol use: Yes    Alcohol/week: 0.0 standard drinks of alcohol    Comment: rare, light    Drug use: No   Sexual activity: Yes    Partners: Male  Other Topics Concern   Not on file  Social History Narrative   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 HEENT:    headaches, difficulty swallowing, tooth/dental problems, sore throat,       sneezing, itching, +ear ache, +nasal congestion, post nasal drip, snoring CV:    chest pain, orthopnea, PND, swelling in lower extremities, anasarca,                                   dizziness, +palpitations Resp:   shortness of breath with exertion or at rest.                productive cough,   non-productive cough, coughing up of blood.              change in color of mucus.  wheezing.   Skin:    rash or lesions. GI:  No-   heartburn, indigestion, abdominal pain, nausea, vomiting, diarrhea,                 change in bowel habits, loss of appetite GU: dysuria, change in color of urine, no urgency or frequency.   flank pain. MS:   joint pain, stiffness, decreased range of motion, back pain. Neuro-     nothing unusual Psych:  change in mood or affect.  depression or +anxiety.   memory loss.  OBJ- Physical Exam General- Alert, Oriented, Affect-appropriate, Distress- none acute, +not obese Skin- rash-none, lesions- none, excoriation- none Lymphadenopathy- none Head- atraumatic            Eyes- Gross vision intact, PERRLA, conjunctivae and secretions clear            Ears- Hearing, canals-normal            Nose- Clear, no-Septal dev, mucus, polyps, erosion, perforation             Throat- Mallampati II-III , mucosa clear , drainage- none, tonsils- atrophic Neck- flexible , trachea midline, no  stridor , thyroid nl, carotid no bruit Chest - symmetrical excursion , unlabored           Heart/CV- RRR now,  no murmur , no gallop  , no rub, nl s1 s2                           - JVD- none , edema- none, stasis changes- none, varices- none           Lung- clear to P&A, wheeze- none, cough- none , dullness-none, rub- none           Chest wall-  Abd-  Br/ Gen/ Rectal- Not done, not indicated Extrem- cyanosis- none, clubbing, none, atrophy- none, strength- nl Neuro- grossly intact to observation

## 2022-12-24 ENCOUNTER — Ambulatory Visit (INDEPENDENT_AMBULATORY_CARE_PROVIDER_SITE_OTHER): Payer: No Typology Code available for payment source | Admitting: Internal Medicine

## 2022-12-24 ENCOUNTER — Encounter: Payer: Self-pay | Admitting: Internal Medicine

## 2022-12-24 VITALS — BP 116/72 | HR 72 | Temp 98.1°F | Ht 66.0 in | Wt 144.4 lb

## 2022-12-24 DIAGNOSIS — F5101 Primary insomnia: Secondary | ICD-10-CM

## 2022-12-24 DIAGNOSIS — R0683 Snoring: Secondary | ICD-10-CM

## 2022-12-24 NOTE — Assessment & Plan Note (Signed)
Appropriate review and discussion with questions answered. Plan- schedule sleep study

## 2022-12-24 NOTE — Patient Instructions (Signed)
Order- schedule home sleep test   dx snoring  Please call 2 weeks after your test for results and recommendations

## 2022-12-24 NOTE — Assessment & Plan Note (Signed)
Occasional Suzanne Marshall has been meeting her needs. Can reconsidr after sleep study if appropriate.

## 2023-01-30 ENCOUNTER — Ambulatory Visit: Payer: No Typology Code available for payment source

## 2023-01-30 DIAGNOSIS — G4733 Obstructive sleep apnea (adult) (pediatric): Secondary | ICD-10-CM

## 2023-01-30 DIAGNOSIS — R0683 Snoring: Secondary | ICD-10-CM

## 2023-02-01 DIAGNOSIS — G4733 Obstructive sleep apnea (adult) (pediatric): Secondary | ICD-10-CM

## 2023-03-26 ENCOUNTER — Ambulatory Visit: Payer: No Typology Code available for payment source | Admitting: Family Medicine

## 2023-03-28 ENCOUNTER — Encounter: Payer: No Typology Code available for payment source | Admitting: Family Medicine

## 2023-04-24 ENCOUNTER — Telehealth: Payer: Self-pay | Admitting: *Deleted

## 2023-04-24 ENCOUNTER — Other Ambulatory Visit: Payer: Self-pay | Admitting: Family

## 2023-04-24 DIAGNOSIS — J4520 Mild intermittent asthma, uncomplicated: Secondary | ICD-10-CM

## 2023-04-24 DIAGNOSIS — J302 Other seasonal allergic rhinitis: Secondary | ICD-10-CM

## 2023-04-24 NOTE — Telephone Encounter (Signed)
Patient was noted on a list given to me by clinical supervisor for lack of recent mammogram results.  I called the patient and she stated she had her last mammogram at Dr Lisbeth Ply office (GYN) and patient is aware a request was sent via fax for the results.

## 2023-04-25 ENCOUNTER — Encounter: Payer: Self-pay | Admitting: Family Medicine

## 2023-04-25 ENCOUNTER — Ambulatory Visit: Payer: No Typology Code available for payment source | Admitting: Internal Medicine

## 2023-04-25 DIAGNOSIS — J302 Other seasonal allergic rhinitis: Secondary | ICD-10-CM

## 2023-04-25 DIAGNOSIS — J4520 Mild intermittent asthma, uncomplicated: Secondary | ICD-10-CM

## 2023-04-25 MED ORDER — MONTELUKAST SODIUM 10 MG PO TABS: ORAL_TABLET | ORAL | 1 refills | Status: DC

## 2023-05-19 NOTE — Progress Notes (Unsigned)
12/24/22- 61 yoF former high school smoker for sleep evaluation to re-establish. Medical problem list includes Asthma, Allergic Rhinitis, Ulcerative Colitis, Interstitial Cystitis, Anxiety and Depression, CAD,  Had seen Dr Vassie Loll once in 2020 but didn't follow through. Covid era disrupted plans. -Ambien 5, Ventolin hfa, Singulair, Zyrtec,  Epworth score-9 Body weight today-144 lbs Covid vax-2 Phizer Flu vax- History of loud snoring, daytime tiredness. Evaluated for paroxysmal AFib. Parental hx of osa. ENT surgery for septoplasty. Ambien about 1x/ week.  05/21/23- 61 yoF former high school smoker folloowed for OSA, complicated by Asthma, Allergic Rhinitis, Ulcerative Colitis, Interstitial Cystitis, Anxiety and Depression, CAD, -Ambien 5, Ventolin hfa, Singulair, Zyrtec,  HST 01/30/23- AHI 20.5/ hr, desaturation to 78%, body weight 144 lbs Body weight today-140 lbs  For treatment decision We reviewed her sleep study and discuss treatment options.  Her concerns for daytime tiredness and snoring.  She has been evaluated for paroxysmal atrial fibrillation and says she is noticing more frequent palpitation and more significant tiredness.  I compared CPAP with oral appliances and explained that Suzanne Marshall might become an option later. We agreed to start by letting her meet with the homecare company about CPAP and with the provider of oral appliances so she can decide.  We also discussed conservative measures including keeping her weight normal and sleeping off flat of back.  ROS-see HPI  + = positive Constitutional:    weight loss, night sweats, fevers, chills, fatigue, lassitude. HEENT:    headaches, difficulty swallowing, tooth/dental problems, sore throat,       sneezing, itching, +ear ache, +nasal congestion, post nasal drip, snoring CV:    chest pain, orthopnea, PND, swelling in lower extremities, anasarca,                                   dizziness, +palpitations Resp:   shortness of breath with  exertion or at rest.                productive cough,   non-productive cough, coughing up of blood.              change in color of mucus.  wheezing.   Skin:    rash or lesions. GI:  No-   heartburn, indigestion, abdominal pain, nausea, vomiting, diarrhea,                 change in bowel habits, loss of appetite GU: dysuria, change in color of urine, no urgency or frequency.   flank pain. MS:   joint pain, stiffness, decreased range of motion, back pain. Neuro-     nothing unusual Psych:  change in mood or affect.  depression or +anxiety.   memory loss.  OBJ- Physical Exam General- Alert, Oriented, Affect-appropriate, Distress- none acute, +not obese Skin- rash-none, lesions- none, excoriation- none Lymphadenopathy- none Head- atraumatic            Eyes- Gross vision intact, PERRLA, conjunctivae and secretions clear            Ears- Hearing, canals-normal            Nose- Clear, no-Septal dev, mucus, polyps, erosion, perforation             Throat- Mallampati II-III , mucosa clear , drainage- none, tonsils- atrophic Neck- flexible , trachea midline, no stridor , thyroid nl, carotid no bruit Chest - symmetrical excursion , unlabored  Heart/CV- RRR now, no murmur , no gallop  , no rub, nl s1 s2                           - JVD- none , edema- none, stasis changes- none, varices- none           Lung- clear to P&A, wheeze- none, cough- none , dullness-none, rub- none           Chest wall-  Abd-  Br/ Gen/ Rectal- Not done, not indicated Extrem- cyanosis- none, clubbing, none, atrophy- none, strength- nl Neuro- grossly intact to observation

## 2023-05-21 ENCOUNTER — Encounter: Payer: Self-pay | Admitting: Internal Medicine

## 2023-05-21 ENCOUNTER — Ambulatory Visit (INDEPENDENT_AMBULATORY_CARE_PROVIDER_SITE_OTHER): Payer: Self-pay | Admitting: Internal Medicine

## 2023-05-21 VITALS — BP 122/64 | HR 64 | Ht 66.0 in | Wt 140.0 lb

## 2023-05-21 DIAGNOSIS — G4733 Obstructive sleep apnea (adult) (pediatric): Secondary | ICD-10-CM

## 2023-05-21 DIAGNOSIS — F5101 Primary insomnia: Secondary | ICD-10-CM

## 2023-05-21 NOTE — Assessment & Plan Note (Signed)
Sleep hygiene and comfort issues addressed.

## 2023-05-21 NOTE — Assessment & Plan Note (Signed)
She will compare CPAP with oral appliance and decide which way she wants to go.  We will make appropriate initial referrals.

## 2023-05-21 NOTE — Patient Instructions (Signed)
Order- referral to Dr Irene Limbo, DDS     consider oral appliance for OSA  Order- new DME, new CPAP auto 5-15, mask of choice, humidifier, supplies, AirView/ card  Please call if we can help

## 2023-06-14 ENCOUNTER — Other Ambulatory Visit: Payer: Self-pay

## 2023-06-14 ENCOUNTER — Telehealth: Payer: Self-pay | Admitting: Internal Medicine

## 2023-06-14 DIAGNOSIS — G4733 Obstructive sleep apnea (adult) (pediatric): Secondary | ICD-10-CM

## 2023-06-14 NOTE — Telephone Encounter (Signed)
Pt is wanting to do CPAP. She is askng for the order to be placed. Wants to poss do the nasal cushion mask

## 2023-06-14 NOTE — Telephone Encounter (Signed)
Order has been placed for patient to get cpap machine. She is aware. NFN

## 2023-08-05 ENCOUNTER — Encounter: Payer: Self-pay | Admitting: Family Medicine

## 2023-08-05 ENCOUNTER — Ambulatory Visit: Payer: BLUE CROSS/BLUE SHIELD | Admitting: Family Medicine

## 2023-08-05 VITALS — BP 102/68 | HR 67 | Temp 98.7°F | Ht 66.0 in | Wt 142.6 lb

## 2023-08-05 DIAGNOSIS — N3010 Interstitial cystitis (chronic) without hematuria: Secondary | ICD-10-CM

## 2023-08-05 DIAGNOSIS — R3129 Other microscopic hematuria: Secondary | ICD-10-CM | POA: Diagnosis not present

## 2023-08-05 DIAGNOSIS — R300 Dysuria: Secondary | ICD-10-CM | POA: Diagnosis not present

## 2023-08-05 LAB — POC URINALSYSI DIPSTICK (AUTOMATED)
Bilirubin, UA: NEGATIVE
Glucose, UA: NEGATIVE
Ketones, UA: NEGATIVE
Leukocytes, UA: NEGATIVE
Nitrite, UA: NEGATIVE
Protein, UA: NEGATIVE
Spec Grav, UA: 1.01 (ref 1.010–1.025)
Urobilinogen, UA: 0.2 E.U./dL
pH, UA: 6 (ref 5.0–8.0)

## 2023-08-05 NOTE — Patient Instructions (Signed)
We sent your urine sample to the lab for culture.  This typically takes a few days to return.  If needed we will then send in a prescription for an antibiotic.

## 2023-08-05 NOTE — Progress Notes (Signed)
Established Patient Office Visit   Subjective  Patient ID: Suzanne Marshall, female    DOB: [birth year]  Age: 62 y.o. MRN: [id number]  Chief Complaint  Patient presents with   Back Pain    Patient came in today for possible UTI, started 3 days ago, back pain, not feeling well, burning     Patient is a 62 year old female followed by Dr. Casimiro Needle and seen for acute concern.  Patient endorses malaise, suprapubic pressure, and low back soreness x 3 days.  Patient notes sleeping a lot yesterday.  Patient denies fever, chills, nausea, vomiting, constipation, sick contacts.  Pt with h/o interstitial cystitis and hematuria.  States symptoms felt likely different.  Patient denies any changes in diet, recent trips, increased spicy food intake.  Eating more tomatoes from her garden but taking Tums to help decrease the acidity.  Also increasing intake of fluids.  Patient followed by urology.    Back Pain    Past Medical History:  Diagnosis Date   Allergic rhinitis    Anxiety and depression 12/17/2014   Arrhythmia 9 yrs ago   with pregnancy   Asthma    Cystitides, interstitial, chronic    Depression    History of irregular menstrual bleeding 12/17/2014   S/p eval with gyn, perimenopausal    Hot flashes 12/17/2014   Insomnia 12/17/2014   Plantar fascial fibromatosis    Ulcerative colitis (HCC) 12/17/2014   Managed by Dr. Laural Benes dx at 62 yrs old   Past Surgical History:  Procedure Laterality Date   COLONOSCOPY WITH PROPOFOL N/A 12/28/2014   Procedure: COLONOSCOPY WITH PROPOFOL;  Surgeon: Charolett Bumpers, MD;  Location: [facility];  Service: Endoscopy;  Laterality: N/A;   colonscopy  last done 7 yrs ago   x 4   DILATION AND CURETTAGE OF UTERUS  12 yrs ago   RHINOPLASTY  yrs ago   Social History   Tobacco Use   Smoking status: Former    Types: Cigarettes   Smokeless tobacco: Never   Tobacco comments:    smoked a very little bit in highschool but she does not consider herself a regular  smoker and hasn't smoked since  Vaping Use   Vaping status: Never Used  Substance Use Topics   Alcohol use: Yes    Alcohol/week: 0.0 standard drinks of alcohol    Comment: rare, light    Drug use: No   Family History  Problem Relation Age of Onset   Heart disease Father 20       MI   Hypertension Father    Bladder Cancer Father    Heart disease Paternal Grandmother    Heart attack Paternal Grandfather 66   Colon cancer Neg Hx    Stomach cancer Neg Hx    Esophageal cancer Neg Hx    Pancreatic cancer Neg Hx    Allergies  Allergen Reactions   Ibuprofen Other (See Comments)    GI upset per patient   Sulfa Antibiotics Rash    And high fever      Review of Systems  Musculoskeletal:  Positive for back pain.   Negative unless stated above    Objective:     BP 102/68 (BP Location: Right Arm, Patient Position: Sitting, Cuff Size: Normal)   Pulse 67   Temp 98.7 F (37.1 C) (Oral)   Ht 5\' 6"  (1.676 m)   Wt 142 lb 9.6 oz (64.7 kg)   LMP 12/06/2014   SpO2 96%   BMI 23.02 kg/m  BP Readings from Last 3 Encounters:  08/05/23 102/68  05/21/23 122/64  12/24/22 116/72   Wt Readings from Last 3 Encounters:  08/05/23 142 lb 9.6 oz (64.7 kg)  05/21/23 140 lb (63.5 kg)  12/24/22 144 lb 6.4 oz (65.5 kg)      Physical Exam Constitutional:      General: She is not in acute distress.    Appearance: Normal appearance.  HENT:     Head: Normocephalic and atraumatic.     Nose: Nose normal.     Mouth/Throat:     Mouth: Mucous membranes are moist.  Cardiovascular:     Rate and Rhythm: Normal rate and regular rhythm.     Heart sounds: Normal heart sounds. No murmur heard.    No gallop.  Pulmonary:     Effort: Pulmonary effort is normal. No respiratory distress.     Breath sounds: Normal breath sounds. No wheezing, rhonchi or rales.  Abdominal:     General: Bowel sounds are normal. There is no distension.     Palpations: Abdomen is soft.     Tenderness: There is no  abdominal tenderness. There is no right CVA tenderness, left CVA tenderness or guarding.  Skin:    General: Skin is warm and dry.  Neurological:     Mental Status: She is alert and oriented to person, place, and time.      Results for orders placed or performed in visit on 08/05/23  POCT Urinalysis Dipstick (Automated)  Result Value Ref Range   Color, UA yellow    Clarity, UA clear    Glucose, UA Negative Negative   Bilirubin, UA neg    Ketones, UA neg    Spec Grav, UA 1.010 1.010 - 1.025   Blood, UA 1+    pH, UA 6.0 5.0 - 8.0   Protein, UA Negative Negative   Urobilinogen, UA 0.2 0.2 or 1.0 E.U./dL   Nitrite, UA neg    Leukocytes, UA Negative Negative      Assessment & Plan:  Interstitial cystitis  Dysuria -     POCT Urinalysis Dipstick (Automated) -     Urine Culture  Other microscopic hematuria  New problem of acute cystitis symptoms.  Discussed possible causes of symptoms including IC versus UTI.  POC UA largely negative aside from 1+ RBCs.  Patient with history of hematuria previously evaluated.  Will obtain UCX.  Symptoms currently appear to be due to IC flare.  Increase p.o. intake of water and fluids, decrease intake of acidic foods/beverages.  Will send in Rx for ABX if needed based on culture results.  Given precautions.  Return if symptoms worsen or fail to improve.   Deeann Saint, MD

## 2023-08-06 LAB — URINE CULTURE
MICRO NUMBER:: 15380901
SPECIMEN QUALITY:: ADEQUATE

## 2023-09-09 ENCOUNTER — Other Ambulatory Visit: Payer: Self-pay | Admitting: Family

## 2023-09-09 DIAGNOSIS — F419 Anxiety disorder, unspecified: Secondary | ICD-10-CM

## 2023-09-17 ENCOUNTER — Telehealth: Payer: Self-pay | Admitting: *Deleted

## 2023-09-17 DIAGNOSIS — F32A Depression, unspecified: Secondary | ICD-10-CM

## 2023-09-17 DIAGNOSIS — F419 Anxiety disorder, unspecified: Secondary | ICD-10-CM

## 2023-09-17 MED ORDER — ESCITALOPRAM OXALATE 20 MG PO TABS: ORAL_TABLET | ORAL | 0 refills | Status: DC

## 2023-09-17 NOTE — Telephone Encounter (Signed)
Rx done. 

## 2023-10-02 LAB — HM MAMMOGRAPHY

## 2023-10-29 ENCOUNTER — Other Ambulatory Visit: Payer: Self-pay | Admitting: Family Medicine

## 2023-10-29 DIAGNOSIS — F32A Depression, unspecified: Secondary | ICD-10-CM

## 2023-12-10 ENCOUNTER — Telehealth: Payer: Self-pay | Admitting: Internal Medicine

## 2023-12-10 ENCOUNTER — Other Ambulatory Visit: Payer: Self-pay

## 2023-12-10 DIAGNOSIS — G4733 Obstructive sleep apnea (adult) (pediatric): Secondary | ICD-10-CM

## 2023-12-10 NOTE — Telephone Encounter (Signed)
Patient needs cpap supplies. Her provider is [facility]. Please call and advise [phone]

## 2024-01-08 ENCOUNTER — Other Ambulatory Visit: Payer: Self-pay | Admitting: Family Medicine

## 2024-01-08 DIAGNOSIS — F32A Depression, unspecified: Secondary | ICD-10-CM

## 2024-01-09 NOTE — Telephone Encounter (Signed)
Pt has not been seen in 1 1/2 years-- please schedule her an appt-- then ok to fil just enough until her appt date-- please remind pt that she needs to be seen at least once per year in order to continue her medications.

## 2024-01-10 NOTE — Telephone Encounter (Signed)
Patient informed of the message below.  Appt was scheduled for a virtual visit per patient's preference on 2/18 as patient stated she fears coming in this time of year due to fear of illness.

## 2024-01-22 ENCOUNTER — Telehealth: Payer: BLUE CROSS/BLUE SHIELD | Admitting: Nurse Practitioner

## 2024-01-22 ENCOUNTER — Encounter: Payer: Self-pay | Admitting: Nurse Practitioner

## 2024-01-22 DIAGNOSIS — G4733 Obstructive sleep apnea (adult) (pediatric): Secondary | ICD-10-CM

## 2024-01-22 NOTE — Assessment & Plan Note (Signed)
Moderate OSA on CPAP. Excellent compliance and control. Receives benefit from use. She will continue to monitor symptoms and notify if she continues to have difficulties with sleep or fatigue. Aware of proper care/use. Understands risks of untreated OSA. Safe driving practices reviewed.   Patient Instructions  Continue to use CPAP every night, minimum of 4-6 hours a night.  Change equipment as directed. Wash your tubing with warm soap and water daily, hang to dry. Wash humidifier portion weekly. Use bottled, distilled water and change daily Be aware of reduced alertness and do not drive or operate heavy machinery if experiencing this or drowsiness.  Exercise encouraged, as tolerated. Healthy weight management discussed.  Avoid or decrease alcohol consumption and medications that make you more sleepy, if possible. Notify if persistent daytime sleepiness occurs even with consistent use of PAP therapy.  Notify if your daytime fatigue symptoms feel like they get worse or impact your day to day activities. We could look at additional medications to help combat this but sounds like things are better for now  Follow up in 1 year with Dr. Maple Hudson or Philis Nettle. If symptoms do not improve or worsen, please contact office for sooner follow up

## 2024-01-22 NOTE — Progress Notes (Signed)
Patient ID: Suzanne Marshall, female     DOB: 08-10-1961, 63 y.o.      MRN: [id number]  Chief Complaint  Patient presents with   Follow-up    Virtual Visit via Video Note  I connected with Suzanne Marshall on 01/22/24 at  9:00 AM EST by a video enabled telemedicine application and verified that I am speaking with the correct person using two identifiers.  Location: Patient: Home Provider: Office    I discussed the limitations of evaluation and management by telemedicine and the availability of in person appointments. The patient expressed understanding and agreed to proceed.  History of Present Illness: 63 year old female, never smoker followed for OSA on CPAP. She is a patient of Dr. Roxy Cedar and last seen in office 06/14/2023. Past medical history significant for asthma, UC, anxiety, depression, HLD, prediabetes.  TESTS/EVENTS: 01/30/2023 HST: AHI 20.5/h, SpO2 78%  05/21/2023: OV with Dr. Maple Hudson. HST with moderate OSA 20.5/h, SpO2 low 78%. Reviewed sleep study and discuss treatment options. Having more palpitations and significant tiredness. Reviewed options - CPAP vs oral appliance. Will evaluate cost of oral appliance vs CPAP.   01/22/2024: Today - follow up Patient presents today for follow up after starting CPAP therapy. She's been doing well with this. She's sleeping better. Sleep feels more restful. She wakes up feeling like she slept well. Still has some nights she tosses and turns but most of the time sleeping well. Fatigue is better. Still has some days where she feels more tired than others but this is tolerable. She denies any drowsy driving or sleep parasomnias/paralysis. No significant leaks. Wearing a nasal cradle mask.  12/22/2023-01/20/2024: CPAP 5-15 cmH2O 30/30 days; 100% <4 hr; average use 9 hr 31 min Pressure 95th 10.2 Leaks 95th 1.2 AHI 1.5  Allergies  Allergen Reactions   Ibuprofen Other (See Comments)    GI upset per patient   Sulfa Antibiotics Rash     And high fever   Immunization History  Administered Date(s) Administered   DTaP 03/19/2013   Influenza,inj,Quad PF,6+ Mos 09/21/2015, 10/12/2019, 10/24/2021   Influenza-Unspecified 10/05/2014, 09/04/2016, 08/10/2018   PFIZER(Purple Top)SARS-COV-2 Vaccination 03/03/2020, 03/31/2020   Past Medical History:  Diagnosis Date   Allergic rhinitis    Anxiety and depression 12/17/2014   Arrhythmia 9 yrs ago   with pregnancy   Asthma    Cystitides, interstitial, chronic    Depression    History of irregular menstrual bleeding 12/17/2014   S/p eval with gyn, perimenopausal    Hot flashes 12/17/2014   Insomnia 12/17/2014   Plantar fascial fibromatosis    Ulcerative colitis (HCC) 12/17/2014   Managed by Dr. Laural Benes dx at 63 yrs old    Tobacco History: Social History   Tobacco Use  Smoking Status Never  Smokeless Tobacco Never  Tobacco Comments   smoked a very little bit in highschool but she does not consider herself a regular smoker and hasn't smoked since   Counseling given: Not Answered Tobacco comments: smoked a very little bit in highschool but she does not consider herself a regular smoker and hasn't smoked since   Outpatient Medications Prior to Visit  Medication Sig Dispense Refill   acetaminophen (TYLENOL) 325 MG tablet Take 650 mg by mouth every 6 (six) hours as needed for moderate pain or headache.     albuterol (VENTOLIN HFA) 108 (90 Base) MCG/ACT inhaler INHALE 2 PUFFS INTO THE LUNGS EVERY 6 HOURS AS NEEDED FOR WHEEZING OR SHORTNESS OF BREATH 8.5 g 1  cetirizine (ZYRTEC) 10 MG tablet Take 5 mg by mouth at bedtime.     Cholecalciferol (VITAMIN D3 PO) Take 25 mcg by mouth daily.     escitalopram (LEXAPRO) 20 MG tablet TAKE 1 TABLET(20 MG) BY MOUTH DAILY 30 tablet 0   montelukast (SINGULAIR) 10 MG tablet TAKE 1 TABLET(10 MG) BY MOUTH DAILY 90 tablet 1   Multiple Vitamin (MULTIVITAMIN WITH MINERALS) TABS tablet Take 1 tablet by mouth daily.     triamcinolone (NASACORT) 55  MCG/ACT AERO nasal inhaler Place 2 sprays into the nose as needed.      zolpidem (AMBIEN) 5 MG tablet TAKE 1 TABLET(5 MG) BY MOUTH AT BEDTIME AS NEEDED FOR SLEEP 30 tablet 0   No facility-administered medications prior to visit.     Review of Systems:   Constitutional: No weight loss or gain, night sweats, fevers, chills, or lassitude. +fatigue (improved) HEENT: No headaches, difficulty swallowing, tooth/dental problems, or sore throat. No sneezing, itching, ear ache, nasal congestion, or post nasal drip CV:  No chest pain, orthopnea, PND, swelling in lower extremities, anasarca, dizziness, palpitations, syncope Resp: No shortness of breath with exertion or at rest. No cough. GI:  No heartburn, indigestion GU: No nocturia  Neuro: No dizziness or lightheadedness.  Psych: No depression or anxiety. Mood stable. +sleep disturbance (improved)  Observations/Objective: Patient is well-developed, well-nourished in no acute distress. A&Ox3. Resting comfortably at home. Unlabored breathing. Speech is clear and coherent with logical content.   Assessment and Plan: OSA (obstructive sleep apnea) Moderate OSA on CPAP. Excellent compliance and control. Receives benefit from use. She will continue to monitor symptoms and notify if she continues to have difficulties with sleep or fatigue. Aware of proper care/use. Understands risks of untreated OSA. Safe driving practices reviewed.   Patient Instructions  Continue to use CPAP every night, minimum of 4-6 hours a night.  Change equipment as directed. Wash your tubing with warm soap and water daily, hang to dry. Wash humidifier portion weekly. Use bottled, distilled water and change daily Be aware of reduced alertness and do not drive or operate heavy machinery if experiencing this or drowsiness.  Exercise encouraged, as tolerated. Healthy weight management discussed.  Avoid or decrease alcohol consumption and medications that make you more sleepy, if  possible. Notify if persistent daytime sleepiness occurs even with consistent use of PAP therapy.  Notify if your daytime fatigue symptoms feel like they get worse or impact your day to day activities. We could look at additional medications to help combat this but sounds like things are better for now  Follow up in 1 year with Dr. Maple Hudson or Philis Nettle. If symptoms do not improve or worsen, please contact office for sooner follow up      I discussed the assessment and treatment plan with the patient. The patient was provided an opportunity to ask questions and all were answered. The patient agreed with the plan and demonstrated an understanding of the instructions.   The patient was advised to call back or seek an in-person evaluation if the symptoms worsen or if the condition fails to improve as anticipated.  I provided 25 minutes of non-face-to-face time during this encounter.   Noemi Chapel, NP

## 2024-01-22 NOTE — Patient Instructions (Addendum)
Continue to use CPAP every night, minimum of 4-6 hours a night.  Change equipment as directed. Wash your tubing with warm soap and water daily, hang to dry. Wash humidifier portion weekly. Use bottled, distilled water and change daily Be aware of reduced alertness and do not drive or operate heavy machinery if experiencing this or drowsiness.  Exercise encouraged, as tolerated. Healthy weight management discussed.  Avoid or decrease alcohol consumption and medications that make you more sleepy, if possible. Notify if persistent daytime sleepiness occurs even with consistent use of PAP therapy.  Notify if your daytime fatigue symptoms feel like they get worse or impact your day to day activities. We could look at additional medications to help combat this but sounds like things are better for now  Follow up in 1 year with Dr. Maple Hudson or Philis Nettle. If symptoms do not improve or worsen, please contact office for sooner follow up

## 2024-01-28 ENCOUNTER — Telehealth (INDEPENDENT_AMBULATORY_CARE_PROVIDER_SITE_OTHER): Payer: BLUE CROSS/BLUE SHIELD | Admitting: Family Medicine

## 2024-01-28 ENCOUNTER — Encounter: Payer: Self-pay | Admitting: Family Medicine

## 2024-01-28 DIAGNOSIS — F32A Depression, unspecified: Secondary | ICD-10-CM | POA: Diagnosis not present

## 2024-01-28 DIAGNOSIS — F419 Anxiety disorder, unspecified: Secondary | ICD-10-CM

## 2024-01-28 DIAGNOSIS — J302 Other seasonal allergic rhinitis: Secondary | ICD-10-CM

## 2024-01-28 DIAGNOSIS — G4700 Insomnia, unspecified: Secondary | ICD-10-CM | POA: Diagnosis not present

## 2024-01-28 MED ORDER — ESCITALOPRAM OXALATE 20 MG PO TABS: ORAL_TABLET | ORAL | 1 refills | Status: DC

## 2024-01-28 MED ORDER — MONTELUKAST SODIUM 10 MG PO TABS
ORAL_TABLET | ORAL | 1 refills | Status: DC
Start: 2024-01-28 — End: 2024-09-17

## 2024-01-28 MED ORDER — ZOLPIDEM TARTRATE 5 MG PO TABS: 5.0000 mg | ORAL_TABLET | Freq: Every day | ORAL | 0 refills | Status: DC

## 2024-01-28 NOTE — Progress Notes (Unsigned)
 Virtual Medical Office Visit  Patient:  Suzanne Marshall      Age: 63 y.o.       Sex:  female  Date:   01/29/2024  PCP:    Karie Georges, MD   Today's Healthcare Provider: Karie Georges, MD    Assessment/Plan:   Summary assessment:  Suzanne Marshall was seen today for medical management of chronic issues.  Anxiety and depression Assessment & Plan: On lexapro 20 mg daily, well controlled symptoms,  Flowsheet Row Video Visit from 01/28/2024 in [facility] [place]  PHQ-9 Total Score 0       Orders: -     Escitalopram Oxalate; TAKE 1 TABLET(20 MG) BY MOUTH DAILY  Dispense: 90 tablet; Refill: 1  Seasonal allergies With uncomplicated asthma, no flare ups this year, doing well on the montelukast and PRN albuterol inhaler. Will continue as prescribed.  -     Montelukast Sodium; TAKE 1 TABLET(10 MG) BY MOUTH DAILY  Dispense: 90 tablet; Refill: 1  Insomnia, unspecified type Assessment & Plan: With new diagnosis of OSA, still occasionall requires a zolpidem at night to sleep. I counseled the patient on chronic long term used of hypnotics andrisk of dependency, offered other non controlled medications however pt wishes to continue the zolpidem. I advised she only use them when absolutely necessary. Will refill for her today  Orders: -     Zolpidem Tartrate; Take 1 tablet (5 mg total) by mouth at bedtime.  Dispense: 30 tablet; Refill: 0     Return in about 3 months (around 04/26/2024) for annual physical exam.   She was advised to call the office or go to ER if her condition worsens    Subjective:   Suzanne Marshall is a 63 y.o. female with PMH significant for: Past Medical History:  Diagnosis Date   Allergic rhinitis    Anxiety and depression 12/17/2014   Arrhythmia 9 yrs ago   with pregnancy   Asthma    Cystitides, interstitial, chronic    Depression    History of irregular menstrual bleeding 12/17/2014   S/p eval with gyn, perimenopausal     Hot flashes 12/17/2014   Insomnia 12/17/2014   Plantar fascial fibromatosis    Ulcerative colitis (HCC) 12/17/2014   Managed by Dr. Laural Benes dx at 63 yrs old     Presenting today with: Chief Complaint  Patient presents with   Medical Management of Chronic Issues     She clarifies and reports that her condition: Pt is here for follow up today, states that she was diagnosed with OSA about 6 months ago and is using her CPAP machine, states that she feels like she is sleeping better, still with some tiredness during the day, getting 8 hours for the most part, occasionally wakes up to use the restroom. States that occasionally she will need to take the zolpidem at night but hasn't really needed it regularly. Only needed 30 tablets for the whole year in 2024. She also alternates this with melatonin, uses them sparingly.  Allergies-- is continuing to take the singulair, states that it is working well for her allergies, taking it year round. Denies any side effects to the medication, needs refills today.  Anxiety/ depression -- pt continues on lexapro 20 mg daily, states that she is doing well on this current dose, symptoms are minimal, she would like to continue the current medications as prescribed             Objective/Observations  Physical Exam:  Polite and friendly Gen: NAD, resting comfortably Pulm: Normal work of breathing Neuro: Grossly normal, moves all extremities Psych: Normal affect and thought content Problem specific physical exam findings:    No images are attached to the encounter or orders placed in the encounter.    Results: No results found for any visits on 01/28/24.   No results found for this or any previous visit (from the past 2160 hours).         Virtual Visit via Video   I connected with Suzanne Marshall on 01/29/24 at 11:30 AM EST by a video enabled telemedicine application and verified that I am speaking with the correct person using two identifiers.  The limitations of evaluation and management by telemedicine and the availability of in person appointments were discussed. The patient expressed understanding and agreed to proceed.   Percentage of appointment time on video:  100% Patient location: Home Provider location:  [place] [facility] Persons participating in the virtual visit: Myself and Patient

## 2024-01-29 NOTE — Assessment & Plan Note (Signed)
 With new diagnosis of OSA, still occasionall requires a zolpidem at night to sleep. I counseled the patient on chronic long term used of hypnotics andrisk of dependency, offered other non controlled medications however pt wishes to continue the zolpidem. I advised she only use them when absolutely necessary. Will refill for her today

## 2024-01-29 NOTE — Assessment & Plan Note (Signed)
 On lexapro 20 mg daily, well controlled symptoms,  Flowsheet Row Video Visit from 01/28/2024 in [facility] Total Score 0

## 2024-05-13 ENCOUNTER — Ambulatory Visit: Admitting: Family Medicine

## 2024-05-13 ENCOUNTER — Encounter: Payer: Self-pay | Admitting: Family Medicine

## 2024-05-13 ENCOUNTER — Ambulatory Visit: Payer: Self-pay

## 2024-05-13 VITALS — BP 134/74 | HR 81 | Temp 98.3°F | Wt 152.7 lb

## 2024-05-13 DIAGNOSIS — H6593 Unspecified nonsuppurative otitis media, bilateral: Secondary | ICD-10-CM | POA: Diagnosis not present

## 2024-05-13 NOTE — Telephone Encounter (Signed)
 Ok to close

## 2024-05-13 NOTE — Telephone Encounter (Signed)
 FYI Only or Action Required?: FYI only for provider  Patient was last seen in primary care on 01/28/2024 by Aida House, MD. Called Nurse Triage reporting Ear Problem. Symptoms began several days ago. Interventions attempted: OTC medications: Decongestants. Symptoms are: unchanged.  Triage Disposition: See Physician Within 24 Hours- Scheduled  Patient/caregiver understands and will follow disposition?: Yes              Copied from CRM [id number]. Topic: Clinical - Red Word Triage >> May 13, 2024 12:24 PM Shereese L wrote: [facility] that prompted transfer to Nurse Triage: Fluid in ears, hard of hearing, slight pain >> May 13, 2024 12:30 PM [employer] wrote: Patient was disconnected before being transferred to NT. Reason for Disposition  Decreased hearing (or another adult says that the ear canal is completely blocked with discharge)  Answer Assessment - Initial Assessment Questions 1. LOCATION: "Which ear is involved?"      ------------ both   2. SYMPTOMS: "What are the main symptoms?" (e.g., pain, redness, itching, discharge)     ---- Ear fullness/Can't hear    3. MOVEMENT: "Does the pain increase when the ear is moved up and down?" Does pushing on the tab of tissue in the front of the ear increase the pain?"      -------------------- Denies    4. PAIN: "How bad is the pain?"  (Scale 1-10; mild, moderate or severe)   - MILD (1-3): doesn't interfere with normal activities    - MODERATE (4-7): interferes with normal activities or awakens from sleep    - SEVERE (8-10): excruciating pain, unable to do any normal activities      ---------------------  3/10    5. ONSET: "When did the ear symptoms start?"      ---- Friday    6. DISCHARGE: "Is there any discharge? What color is it?"      ------ Denies   7. SWIMMING: "Have you been swimming recently?" If Yes, ask: "How often do you swim? Is it in a pool, lake or ocean?"      --- Yes, was recently in [place]. S/s appeared after  her flight back.    8. COTTON EAR SWABS: "Do you use cotton ear swabs (Q-tips)? How often?" (e.g., never, daily, weekly)     ------------- Denies     Additional Info:  ---Has some ear popping now.  -- Runny nose as well  ---- Fever: appeared early, now dissipated. ---Meds: Decongestant: Mild relief.  Protocols used: Ear - Swimmer's-A-AH

## 2024-05-13 NOTE — Telephone Encounter (Signed)
 Copied from CRM [id number]. Topic: Clinical - Red Word Triage >> May 13, 2024 12:24 PM Shereese L wrote: [facility] that prompted transfer to Nurse Triage: Fluid in ears, hard of hearing, slight pain

## 2024-05-13 NOTE — Telephone Encounter (Signed)
 This encounter was created in error - please disregard.

## 2024-05-13 NOTE — Progress Notes (Signed)
 Established Patient Office Visit  Subjective   Patient ID: Suzanne Marshall, female    DOB: Nov 07, 1961  Age: 63 y.o. MRN: [id number]  Chief Complaint  Patient presents with   Ear Problem    HPI   [name] is seen with bilateral ear fullness.  She and her husband just got back from [place]  on Saturday.  She had onset of typical URI type symptoms around Thursday.  She denies any sore throat has some nasal congestion.  No fever.  No significant cough.  No chills.  She had some ear fullness which seem to be worse after flying back.  Denies any vertigo.  No real ear pain.  She did do some swimming over [place]  but no symptoms of swimmer's ear.  She also used some eardrops as a preventative for swimmer's ear when she was over there.  No major hearing loss.  She has tried some decongestant with Sudafed since she has been back without any improvement.  Past Medical History:  Diagnosis Date   Allergic rhinitis    Anxiety and depression 12/17/2014   Arrhythmia 9 yrs ago   with pregnancy   Asthma    Cystitides, interstitial, chronic    Depression    History of irregular menstrual bleeding 12/17/2014   S/p eval with gyn, perimenopausal    Hot flashes 12/17/2014   Insomnia 12/17/2014   Plantar fascial fibromatosis    Ulcerative colitis (HCC) 12/17/2014   Managed by Dr. Lincoln Renshaw dx at 63 yrs old   Past Surgical History:  Procedure Laterality Date   COLONOSCOPY WITH PROPOFOL  N/A 12/28/2014   Procedure: COLONOSCOPY WITH PROPOFOL ;  Surgeon: Garrett Kallman, MD;  Location: [facility];  Service: Endoscopy;  Laterality: N/A;   colonscopy  last done 7 yrs ago   x 4   DILATION AND CURETTAGE OF UTERUS  12 yrs ago   RHINOPLASTY  yrs ago    reports that she has never smoked. She has never used smokeless tobacco. She reports current alcohol use. She reports that she does not use drugs. family history includes Bladder Cancer in her father; Heart attack (age of onset: 46) in her paternal grandfather;  Heart disease in her paternal grandmother; Heart disease (age of onset: 102) in her father; Hypertension in her father. Allergies  Allergen Reactions   Ibuprofen Other (See Comments)    GI upset per patient   Sulfa Antibiotics Rash    And high fever    Review of Systems  Constitutional:  Negative for chills and fever.  HENT:  Negative for ear discharge, ear pain, hearing loss, sinus pain and tinnitus.   Respiratory:  Negative for cough.       Objective:     BP 134/74 (BP Location: Left Arm, Patient Position: Sitting, Cuff Size: Normal)   Pulse 81   Temp 98.3 F (36.8 C) (Oral)   Wt 152 lb 11.2 oz (69.3 kg)   LMP 12/06/2014   SpO2 98%   BMI 24.65 kg/m  BP Readings from Last 3 Encounters:  05/13/24 134/74  01/28/24 125/80  08/05/23 102/68   Wt Readings from Last 3 Encounters:  05/13/24 152 lb 11.2 oz (69.3 kg)  01/28/24 145 lb (65.8 kg)  08/05/23 142 lb 9.6 oz (64.7 kg)      Physical Exam Vitals reviewed.  Constitutional:      Appearance: Normal appearance.  HENT:     Right Ear: External ear normal.     Left Ear: External ear normal.  Ears:     Comments: Bilateral serous effusions right greater than left.  She has somewhat retracted eardrums.  No significant erythema.  No suppurative or purulent changes.  Ear canals appear normal. Cardiovascular:     Rate and Rhythm: Normal rate and regular rhythm.  Pulmonary:     Effort: Pulmonary effort is normal.     Breath sounds: Normal breath sounds. No wheezing or rales.  Neurological:     Mental Status: She is alert.      No results found for any visits on 05/13/24.    The 10-year ASCVD risk score (Arnett DK, et al., 2019) is: 5%    Assessment & Plan:   Bilateral otitis media with effusion.  No evidence any suppurative changes.  She has large effusions visible bilaterally.  We explained these are usually self-limited.  We discussed limited, if any, value in using medications such as antihistamines or  decongestants.  Follow-up promptly for any fever or progressive ear pain.  Consider ENT referral if not improving over the next several weeks.  Glean Lamy, MD

## 2024-06-08 ENCOUNTER — Encounter: Payer: Self-pay | Admitting: Family Medicine

## 2024-06-08 ENCOUNTER — Emergency Department (HOSPITAL_BASED_OUTPATIENT_CLINIC_OR_DEPARTMENT_OTHER): Admitting: Radiology

## 2024-06-08 ENCOUNTER — Other Ambulatory Visit: Payer: Self-pay

## 2024-06-08 ENCOUNTER — Emergency Department (HOSPITAL_BASED_OUTPATIENT_CLINIC_OR_DEPARTMENT_OTHER)

## 2024-06-08 ENCOUNTER — Encounter (HOSPITAL_BASED_OUTPATIENT_CLINIC_OR_DEPARTMENT_OTHER): Payer: Self-pay | Admitting: Emergency Medicine

## 2024-06-08 ENCOUNTER — Ambulatory Visit: Payer: Self-pay

## 2024-06-08 ENCOUNTER — Telehealth: Payer: Self-pay | Admitting: Cardiology

## 2024-06-08 ENCOUNTER — Emergency Department (HOSPITAL_BASED_OUTPATIENT_CLINIC_OR_DEPARTMENT_OTHER)
Admission: EM | Admit: 2024-06-08 | Discharge: 2024-06-08 | Disposition: A | Discharge status: Home / Self Care | Attending: Emergency Medicine | Admitting: Emergency Medicine

## 2024-06-08 DIAGNOSIS — R002 Palpitations: Secondary | ICD-10-CM | POA: Insufficient documentation

## 2024-06-08 DIAGNOSIS — R030 Elevated blood-pressure reading, without diagnosis of hypertension: Secondary | ICD-10-CM | POA: Diagnosis not present

## 2024-06-08 DIAGNOSIS — M546 Pain in thoracic spine: Secondary | ICD-10-CM | POA: Insufficient documentation

## 2024-06-08 DIAGNOSIS — R1013 Epigastric pain: Secondary | ICD-10-CM | POA: Insufficient documentation

## 2024-06-08 LAB — BASIC METABOLIC PANEL WITH GFR
Anion gap: 10 (ref 5–15)
BUN: 12 mg/dL (ref 8–23)
CO2: 26 mmol/L (ref 22–32)
Calcium: 10.4 mg/dL — ABNORMAL HIGH (ref 8.9–10.3)
Chloride: 104 mmol/L (ref 98–111)
Creatinine, Ser: 0.81 mg/dL (ref 0.44–1.00)
GFR, Estimated: >60 mL/min (ref >60)
Glucose, Bld: 112 mg/dL — ABNORMAL HIGH (ref 70–99)
Potassium: 4.2 mmol/L (ref 3.5–5.1)
Sodium: 141 mmol/L (ref 135–145)

## 2024-06-08 LAB — CBC
HCT: 39.2 % (ref 36.0–46.0)
Hemoglobin: 13.3 g/dL (ref 12.0–15.0)
MCH: 30.2 pg (ref 26.0–34.0)
MCHC: 33.9 g/dL (ref 30.0–36.0)
MCV: 89.1 fL (ref 80.0–100.0)
Platelets: 333 10*3/uL (ref 150–400)
RBC: 4.4 MIL/uL (ref 3.87–5.11)
RDW: 12.4 % (ref 11.5–15.5)
WBC: 7.4 10*3/uL (ref 4.0–10.5)
nRBC: 0 % (ref 0.0–0.2)

## 2024-06-08 LAB — HEPATIC FUNCTION PANEL
ALT: 20 U/L (ref 0–44)
AST: 23 U/L (ref 15–41)
Albumin: 4.4 g/dL (ref 3.5–5.0)
Alkaline Phosphatase: 71 U/L (ref 38–126)
Bilirubin, Direct: 0.1 mg/dL (ref 0.0–0.2)
Indirect Bilirubin: 0.2 mg/dL — ABNORMAL LOW (ref 0.3–0.9)
Total Bilirubin: 0.3 mg/dL (ref 0.0–1.2)
Total Protein: 7.5 g/dL (ref 6.5–8.1)

## 2024-06-08 LAB — TROPONIN T, HIGH SENSITIVITY
Troponin T High Sensitivity: <15 ng/L (ref <19)
Troponin T High Sensitivity: <15 ng/L (ref <19)

## 2024-06-08 LAB — LIPASE, BLOOD: Lipase: 43 U/L (ref 11–51)

## 2024-06-08 MED ORDER — HYDROCHLOROTHIAZIDE 25 MG PO TABS: 12.5000 mg | ORAL_TABLET | Freq: Every day | ORAL | 0 refills | Status: DC

## 2024-06-08 MED ORDER — IOHEXOL 350 MG/ML SOLN
100.0000 mL | Freq: Once | INTRAVENOUS | Status: AC | PRN
  Administered 2024-06-08: 80 mL via INTRAVENOUS

## 2024-06-08 NOTE — Telephone Encounter (Signed)
 Copied from CRM [id number]. Topic: Clinical - Red Word Triage >> Jun 08, 2024  1:19 PM Macario HERO wrote: Red Word that prompted transfer to Nurse Triage: Patient spouse stated her blood pressure spike yesterday close to 200 and still hight today. her pulse rate has been up and down since yesterday.   FYI Only or Action Required?: FYI only for provider.  Patient was last seen in primary care on 05/13/2024 by Micheal Wolm ORN, MD. Called Nurse Triage reporting Hypertension. Symptoms began yesterday. Interventions attempted: Rest, hydration, or home remedies. Symptoms are: unchanged.  Triage Disposition: Go to ED Now (Notify PCP)  Patient/caregiver understands and will follow disposition?: Unsure    I advised with the patient's symptoms with her blood pressure that she should go to the ED for evaluation. Patient and husband verbalized understanding but stated they do not want to wait in the ED for hours. Patient already haa an appointment scheduled for tomorrow.     Reason for Disposition  [1] Systolic BP  >= 160 OR Diastolic >= 100 AND [2] cardiac (e.g., breathing difficulty, chest pain) or neurologic symptoms (e.g., new-onset blurred or double vision, unsteady gait)  Answer Assessment - Initial Assessment Questions 1. BLOOD PRESSURE: What is the blood pressure? Did you take at least two measurements 5 minutes apart?     170/100 today, 200/100 yesterday  2. ONSET: When did you take your blood pressure?     Yesterday 3. HOW: How did you take your blood pressure? (e.g., automatic home BP monitor, visiting nurse)     Automatic BP cuff 4. HISTORY: Do you have a history of high blood pressure?     No 5. MEDICINES: Are you taking any medicines for blood pressure? Have you missed any doses recently?     No 6. OTHER SYMPTOMS: Do you have any symptoms? (e.g., blurred vision, chest pain, difficulty breathing, headache, weakness)     Elevated heart rate, back/upper abdomen pain,  dizziness, headache  Protocols used: Blood Pressure - High-A-AH

## 2024-06-08 NOTE — Discharge Instructions (Signed)
 You were seen in the ER today for concerns of palpitations, back pain, and pain in your upper abdomen. Your labs and imaging were thankfully reassuring with no signs of a heart attack, pulmonary embolism, or injury to your the blood vessels of your chest, abdomen, or pelvis. I am unsure what caused your pain in your back and upper abdomen at this time. I am concerned that your blood pressure has been severely elevated at home and even while with us  here today. I am starting you on a low dose blood pressure medication called hydrochlorothiazide which you will take once daily. You can follow up with your primary care provider regarding this medication and follow their instructions regarding any potential changes or modifications to your plan. If you have any concerns of new or worsening symptoms, return to the ER.

## 2024-06-08 NOTE — Telephone Encounter (Signed)
 Lonni Slain, MD to Me     06/08/24  2:36 PM Thanks. Agree. Looks like she is checking into ER--initial ECG shows sinus with PVCs. Labs pending.

## 2024-06-08 NOTE — ED Triage Notes (Signed)
 Intense upper back pain yesterday with sys BP 200's. Took several ASA. Today feels heart palpitations- possibly afib on pulse ox. Denies pain today. 2 baby ASA today.

## 2024-06-08 NOTE — Telephone Encounter (Signed)
 Patient c/o Palpitations: STAT if patient c/o lightheadedness, shortness of breath, or chest pain  How long have you had palpitations/irregular HR/ Afib? Are you having the symptoms now? Yesterday fro 10 minutes   Are you currently experiencing lightheadedness, SOB or CP? No   Do you have a history of afib (atrial fibrillation) or irregular heart rhythm? Yes  Have you checked your BP or HR? (document readings if available): BP: 187 Yesterday   Are you experiencing any other symptoms? No

## 2024-06-08 NOTE — ED Notes (Signed)
 Pt given discharge instructions and reviewed prescriptions. Opportunities given for questions. Pt verbalizes understanding. PIV removed x1. Jillyn Hidden, RN

## 2024-06-08 NOTE — ED Provider Notes (Signed)
 [facility] DEPARTMENT AT [facility] Provider Note   CSN: [id number] Arrival date & time: 06/08/24  1408     Patient presents with: Palpitations   Suzanne Marshall is a 63 y.o. female.  Patient past history significant for prediabetes, obstructive sleep apnea, anxiety depression, hyperlipidemia, frequent PVCs presents emergency department with concerns of palpitations and upper back pain.  She reports yesterday having severely elevated blood pressure with vitals showing systolic pressure in the 200s.  Patient reports taking 2 aspirins yesterday which did improve her symptoms.  Today states ongoing feelings of increasing palpitations and some epigastric tenderness but denies any feelings of nausea or vomiting.  No prior history of ACS.  She reports that the pain in her back yesterday was between the shoulder blades and lasted about 15 minutes but has now resolved.  The pain in her epigastrium today lasted about 10 minutes but has not resolved as well.  Denies any feelings of pain at this time.  Initial vitals show significantly elevated systolic blood pressure at 181.  No prior history of hypertension that patient has been treated or managed for.   Palpitations      Prior to Admission medications   Medication Sig Start Date End Date Taking? Authorizing Provider  hydrochlorothiazide  (HYDRODIURIL ) 25 MG tablet Take 0.5 tablets (12.5 mg total) by mouth daily. 06/08/24  Yes Nahmir Zeidman A, PA-C  acetaminophen (TYLENOL) 325 MG tablet Take 650 mg by mouth every 6 (six) hours as needed for moderate pain or headache.    [provider]  albuterol  (VENTOLIN  HFA) 108 (90 Base) MCG/ACT inhaler INHALE 2 PUFFS INTO THE LUNGS EVERY 6 HOURS AS NEEDED FOR WHEEZING OR SHORTNESS OF BREATH 01/16/22   Gladis, Mary-Margaret, FNP  cetirizine (ZYRTEC) 10 MG tablet Take 5 mg by mouth at bedtime.    [provider]  Cholecalciferol (VITAMIN D3 PO) Take 25 mcg by mouth daily.     [provider]  escitalopram  (LEXAPRO ) 20 MG tablet TAKE 1 TABLET(20 MG) BY MOUTH DAILY 01/28/24   Ozell Heron HERO, MD  montelukast  (SINGULAIR ) 10 MG tablet TAKE 1 TABLET(10 MG) BY MOUTH DAILY 01/28/24   Ozell Heron HERO, MD  Multiple Vitamin (MULTIVITAMIN WITH MINERALS) TABS tablet Take 1 tablet by mouth daily.    [provider]  NON FORMULARY CPAP machine    [provider]  triamcinolone (NASACORT) 55 MCG/ACT AERO nasal inhaler Place 2 sprays into the nose as needed.     [provider]  zolpidem  (AMBIEN ) 5 MG tablet Take 1 tablet (5 mg total) by mouth at bedtime. 01/28/24   Ozell Heron HERO, MD    Allergies: Ibuprofen and Sulfa antibiotics    Review of Systems  Cardiovascular:  Positive for palpitations.  All other systems reviewed and are negative.   Updated Vital Signs BP 124/69   Pulse 72   Temp 98.3 F (36.8 C)   Resp 12   LMP 12/06/2014   SpO2 94%   Physical Exam Vitals and nursing note reviewed.  Constitutional:      General: She is not in acute distress.    Appearance: She is well-developed.  HENT:     Head: Normocephalic and atraumatic.   Eyes:     Conjunctiva/sclera: Conjunctivae normal.    Cardiovascular:     Rate and Rhythm: Normal rate and regular rhythm.     Heart sounds: Normal heart sounds. Heart sounds not distant. No murmur heard.    Comments: No appreciable carotid bruit.  Symmetric pulses in left and right radial arteries. Pulmonary:     Effort: Pulmonary effort is normal. No respiratory distress.     Breath sounds: Normal breath sounds.  Abdominal:     Palpations: Abdomen is soft.     Tenderness: There is no abdominal tenderness.   Musculoskeletal:        General: No swelling.     Cervical back: Neck supple.   Skin:    General: Skin is warm and dry.     Capillary Refill: Capillary refill takes less than 2 seconds.   Neurological:     Mental Status: She is alert.   Psychiatric:        Mood and  Affect: Mood normal.     (all labs ordered are listed, but only abnormal results are displayed) Labs Reviewed  BASIC METABOLIC PANEL WITH GFR - Abnormal; Notable for the following components:      Result Value   Glucose, Bld 112 (*)    Calcium  10.4 (*)    All other components within normal limits  HEPATIC FUNCTION PANEL - Abnormal; Notable for the following components:   Indirect Bilirubin 0.2 (*)    All other components within normal limits  CBC  LIPASE, BLOOD  TROPONIN T, HIGH SENSITIVITY  TROPONIN T, HIGH SENSITIVITY    EKG: None  Radiology: CT Angio Chest/Abd/Pel for Dissection W and/or Wo Contrast Result Date: 06/08/2024 CLINICAL DATA:  Upper back pain since yesterday, hypertension, palpitations EXAM: CT ANGIOGRAPHY CHEST, ABDOMEN AND PELVIS TECHNIQUE: Non-contrast CT of the chest was initially obtained. Multidetector CT imaging through the chest, abdomen and pelvis was performed using the standard protocol during bolus administration of intravenous contrast. Multiplanar reconstructed images and MIPs were obtained and reviewed to evaluate the vascular anatomy. RADIATION DOSE REDUCTION: This exam was performed according to the departmental dose-optimization program which includes automated exposure control, adjustment of the mA and/or kV according to patient size and/or use of iterative reconstruction technique. CONTRAST:  80mL OMNIPAQUE  IOHEXOL  350 MG/ML SOLN COMPARISON:  08/30/2021, 02/02/2019 FINDINGS: CTA CHEST FINDINGS Cardiovascular: No evidence of thoracic aortic aneurysm or dissection. Great vessels are widely patent. There is technically adequate opacification of the pulmonary vasculature. No filling defects or pulmonary emboli. The heart is unremarkable without pericardial effusion. Mediastinum/Nodes: No enlarged mediastinal, hilar, or axillary lymph nodes. Thyroid  gland, trachea, and esophagus demonstrate no significant findings. Small hiatal hernia. Lungs/Pleura: No acute  airspace disease, effusion, or pneumothorax. Central airways are patent. Musculoskeletal: No acute or destructive bony abnormalities. Reconstructed images demonstrate no additional findings. Review of the MIP images confirms the above findings. CTA ABDOMEN AND PELVIS FINDINGS VASCULAR Aorta: Normal caliber aorta without aneurysm, dissection, vasculitis or significant stenosis. Celiac: Patent without evidence of aneurysm, dissection, vasculitis or significant stenosis. SMA: Patent without evidence of aneurysm, dissection, vasculitis or significant stenosis. Renals: Both renal arteries are patent without evidence of aneurysm, dissection, vasculitis, fibromuscular dysplasia or significant stenosis. IMA: Patent without evidence of aneurysm, dissection, vasculitis or significant stenosis. Inflow: Patent without evidence of aneurysm, dissection, vasculitis or significant stenosis. Veins: No obvious venous abnormality within the limitations of this arterial phase study. Review of the MIP images confirms the above findings. NON-VASCULAR Hepatobiliary: No focal liver abnormality is seen. No gallstones, gallbladder wall thickening, or biliary dilatation. Pancreas: Unremarkable. No pancreatic ductal dilatation or surrounding inflammatory changes. Spleen: Normal in size without focal abnormality. Adrenals/Urinary Tract: The adrenals are unremarkable. The kidneys enhance normally and symmetrically. No urinary tract calculi or obstructive uropathy. Bladder is unremarkable.  Stomach/Bowel: No bowel obstruction or ileus. Normal appendix right lower quadrant. No bowel wall thickening or inflammatory change. Lymphatic: No pathologic adenopathy within the abdomen or pelvis. Reproductive: Stable calcified uterine fibroid within the right aspect of the uterine fundus. No adnexal masses. Other: No free fluid or free intraperitoneal gas. No abdominal wall hernia. Musculoskeletal: No acute or destructive bony abnormalities. Reconstructed  images demonstrate no additional findings. Review of the MIP images confirms the above findings. IMPRESSION: 1. No evidence of thoracoabdominal aortic aneurysm or dissection. No significant vascular findings. 2. No evidence of pulmonary embolus. 3. No acute intrathoracic, intra-abdominal, or intrapelvic process. Electronically Signed   By: Ozell Daring M.D.   On: 06/08/2024 15:49   DG Chest 2 View Result Date: 06/08/2024 CLINICAL DATA:  Provided history: palp with upper back pain Elevated blood pressure. EXAM: CHEST - 2 VIEW COMPARISON:  Cardiac CT 08/30/2021 FINDINGS: The cardiomediastinal contours are normal. The lungs are clear. Pulmonary vasculature is normal. No consolidation, pleural effusion, or pneumothorax. No acute osseous abnormalities are seen. IMPRESSION: No active cardiopulmonary disease. Electronically Signed   By: Andrea Gasman M.D.   On: 06/08/2024 15:31     Procedures   Medications Ordered in the ED  iohexol  (OMNIPAQUE ) 350 MG/ML injection 100 mL (80 mLs Intravenous Contrast Given 06/08/24 1520)                                   Medical Decision Making Amount and/or Complexity of Data Reviewed Labs: ordered. Radiology: ordered.  Risk Prescription drug management.   This patient presents to the ED for concern of palpitations, back pain, this involves an extensive number of treatment options, and is a complaint that carries with it a high risk of complications and morbidity.  The differential diagnosis includes aortic dissection, ACS, PE, pneumonia, pancreatitis   Co morbidities that complicate the patient evaluation  Hyperlipidemia, arrhythmias, ulcerative colitis, asthma   Additional history obtained:  Additional history obtained from most recent outpatient visit show no significant hypertension with visit on 6//2025 showing blood pressure at 134/74.   Lab Tests:  I Ordered, and personally interpreted labs.  The pertinent results include: CBC unremarkable,  BMP with slight hypercalcemia 10.4 and glucose elevated at 112, hepatic function panel unremarkable, troponin less than 15 repeat troponin less than 15, lipase normal at 43   Imaging Studies ordered:  I ordered imaging studies including chest x-ray, CT angio chest/abdomen/pelvis for dissection I independently visualized and interpreted imaging which showed chest x-ray with no acute cardiopulmonary finding seen, CT angio chest negative for any signs of aortic dissection or other injuries I agree with the radiologist interpretation   Cardiac Monitoring: / EKG:  The patient was maintained on a cardiac monitor.  I personally viewed and interpreted the cardiac monitored which showed an underlying rhythm of: Sinus rhythm with frequent PVCs   Consultations Obtained:  I requested consultation with none,  and discussed lab and imaging findings as well as pertinent plan - they recommend: N/A   Problem List / ED Course / Critical interventions / Medication management  Patient presents to the emergency department concerns of palpitations and elevated blood pressure.  Reports that yesterday had severe pain towards the upper back between his shoulder blades that lasted about 10 to 15 minutes.  No prior history of ACS, PE, or any cardiopulmonary conditions as far she is aware.  States that she follows with her cardiologist but  has no diagnosed history of arrhythmias.  Denies any recent changes to any of her home medications, or caffeine use. On exam, patient is well-appearing.  Vitals are largely reassuring the patient is notably hypertensive with initial blood pressure showing systolic above 180.  No normal heart or lung sounds.  No pitting edema seen to the lower extremities.  Patient was again does reiterate that she had severe pain in the upper back between her shoulder blades for about 10 or 15 minutes yesterday.  Had a brief episode about 5 or 10 minutes of epigastric pain today.  Denies any nausea  vomiting or diarrhea.  No diaphoresis. Given patient's reported history and significant hypertension on arrival, concern for possible dissection given combination of symptoms.  Will proceed with cardiac workup including CT angio imaging for evaluation of possible dissection.  Patient with significant risk factors such as tobacco use, alcohol use, or known hypertension.  She does report that she had previously been told about an abnormal calcium  score from her cardiologist but is not currently on any statin therapy. Blood work was reassuring without any abnormal findings seen.  Chest x-ray and CT angio for dissection evaluation are thankfully also normal.  No signs of aortic dissection.  No specific evidence of any source of current symptoms.  Patient's blood pressure has improved with rest here in her bed down to about 140 systolic.  Still higher than patient's reported baseline typically between 0100-0110 systolic.  Patient may be developing hypertension secondary to other causes versus aging.  Not currently on any antihypertensive and had discussion about possible initiating antihypertensive therapy given her elevated blood pressures and her symptoms.  Patient would prefer to initiate therapy versus waiting for follow-up with PCP.  I advised patient that PCP will ultimately be the best resource for management of her blood pressure medication if she stays on this for a prolonged period of time.  Will initiate a low-dose hydrochlorothiazide .  Return precautions discussed such as concerns for new or worsening symptoms.  Stable for patient follow-up and discharged home. I have reviewed the patients home medicines and have made adjustments as needed   Test / Admission - Considered:  Admission considered but patient not meeting criteria of hypertensive emergency.  Final diagnoses:  Blood pressure elevated without history of HTN  Acute midline thoracic back pain  Epigastric discomfort    ED Discharge  Orders          Ordered    hydrochlorothiazide  (HYDRODIURIL ) 25 MG tablet  Daily        06/08/24 1736               Klein Willcox A, PA-C 06/08/24 1754    Levander Houston, MD 06/18/24 1017

## 2024-06-08 NOTE — Telephone Encounter (Signed)
 Spoke with patient regarding symptoms Yesterday had severe back pain, blood pressure 195/100 HR 42 She did belch and pain improved Also had upper stomach pain Denies pain at this time  Is feeling lightheaded, thinks she has atrial fibrillation because her mother has Spoke with PCP earlier today and was advised to go to ED, currently in route to [facility]  Has appt with PCP tomorrow  Advised no available appointments today with APP and Dr Lonni is out of the office   Will forward to Dr Lonni for review

## 2024-06-09 ENCOUNTER — Encounter: Payer: Self-pay | Admitting: Family Medicine

## 2024-06-09 ENCOUNTER — Ambulatory Visit: Admitting: Family Medicine

## 2024-06-09 VITALS — BP 122/72 | HR 80 | Temp 98.1°F | Ht 66.0 in | Wt 151.9 lb

## 2024-06-09 DIAGNOSIS — R739 Hyperglycemia, unspecified: Secondary | ICD-10-CM | POA: Diagnosis not present

## 2024-06-09 DIAGNOSIS — I493 Ventricular premature depolarization: Secondary | ICD-10-CM

## 2024-06-09 DIAGNOSIS — R030 Elevated blood-pressure reading, without diagnosis of hypertension: Secondary | ICD-10-CM

## 2024-06-09 NOTE — Telephone Encounter (Signed)
 Noted- ok to close.

## 2024-06-09 NOTE — Progress Notes (Signed)
 Established Patient Office Visit  Subjective   Patient ID: Suzanne Marshall, female    DOB: 03-11-1961  Age: 63 y.o. MRN: [id number]  Chief Complaint  Patient presents with   Hospitalization Follow-up    Patient presents for ER follow up    Pt states that about 3-4 days ago she started having elevated blood pressure, states that her back was hurting very badly and also in the upper stomach area. No nausea or vomiting, no diarrhea. Felt lightheaded as well. Her BP was high at home and she became concerned. States that she went to the ED and had a full work up, CTA was negative. Work up included labs EKG and CXR, CTA which were largely unremarkable except the frequent PVC's which were previously known to the patient. BP today is back to normal. Has a cardiologist Dr. Lonni and she didn't have an appointment until October available. States that she is going to get in sooner if she can.   Pt states she has weaned herself off of the lexapro , states that she is feeling ok off of the medication, states that she has a lot of stress with caring for her mom a lot.   Current Outpatient Medications  Medication Instructions   acetaminophen (TYLENOL) 650 mg, Every 6 hours PRN   albuterol  (VENTOLIN  HFA) 108 (90 Base) MCG/ACT inhaler INHALE 2 PUFFS INTO THE LUNGS EVERY 6 HOURS AS NEEDED FOR WHEEZING OR SHORTNESS OF BREATH   cetirizine (ZYRTEC) 5 mg, Daily at bedtime   Cholecalciferol (VITAMIN D3 PO) 25 mcg, Daily   montelukast  (SINGULAIR ) 10 MG tablet TAKE 1 TABLET(10 MG) BY MOUTH DAILY   Multiple Vitamin (MULTIVITAMIN WITH MINERALS) TABS tablet 1 tablet, Daily   NON FORMULARY CPAP machine   triamcinolone (NASACORT) 55 MCG/ACT AERO nasal inhaler 2 sprays, As needed   zolpidem  (AMBIEN ) 5 mg, Oral, Daily at bedtime    Patient Active Problem List   Diagnosis Date Noted   Prediabetes 07/23/2022   Hyperlipidemia 07/23/2022   OSA (obstructive sleep apnea) 01/30/2019   Hot flashes 12/17/2014    Anxiety and depression 12/17/2014   Insomnia 12/17/2014   History of irregular menstrual bleeding 12/17/2014   Ulcerative colitis (HCC) 12/17/2014   Uncomplicated asthma 08/04/2014      Review of Systems  All other systems reviewed and are negative.     Objective:     BP 122/72   Pulse 80   Temp 98.1 F (36.7 C) (Oral)   Ht 5' 6 (1.676 m)   Wt 151 lb 14.4 oz (68.9 kg)   LMP 12/06/2014   SpO2 98%   BMI 24.52 kg/m    Physical Exam Vitals reviewed.  Constitutional:      Appearance: Normal appearance. She is well-groomed and normal weight.   Eyes:     Conjunctiva/sclera: Conjunctivae normal.   Neck:     Thyroid : No thyromegaly.   Cardiovascular:     Rate and Rhythm: Normal rate and regular rhythm.     Pulses: Normal pulses.     Heart sounds: S1 normal and S2 normal.  Pulmonary:     Effort: Pulmonary effort is normal.     Breath sounds: Normal breath sounds and air entry.  Abdominal:     General: Bowel sounds are normal.   Musculoskeletal:     Right lower leg: No edema.     Left lower leg: No edema.   Neurological:     Mental Status: She is alert and oriented to person, place,  and time. Mental status is at baseline.     Gait: Gait is intact.   Psychiatric:        Mood and Affect: Mood and affect normal.        Speech: Speech normal.        Behavior: Behavior normal.        Judgment: Judgment normal.      No results found for any visits on 06/09/24.    The 10-year ASCVD risk score (Arnett DK, et al., 2019) is: 4.2%    Assessment & Plan:  Elevated blood pressure reading -     TSH; Future  Frequent PVCs -     TSH; Future  Hyperglycemia -     Hemoglobin A1c; Future  Symptoms in her back and abdomen resolved. Will check TSH and A1C to rule out any other lab abnormalities that could explain her symptoms. I spent 20 minutes reviewing the ED notes ,labs and imaging as well as the EKG   Return for annual physical exam -- can schedule any time  at your convenience.    Heron CHRISTELLA Sharper, MD

## 2024-06-10 LAB — TSH: TSH: 1.45 u[IU]/mL (ref 0.35–5.50)

## 2024-06-10 LAB — HEMOGLOBIN A1C: Hgb A1c MFr Bld: 6.2 % (ref 4.6–6.5)

## 2024-06-16 ENCOUNTER — Ambulatory Visit: Payer: Self-pay | Admitting: Family Medicine

## 2024-06-18 ENCOUNTER — Encounter: Admitting: Family Medicine

## 2024-07-16 ENCOUNTER — Encounter: Payer: Self-pay | Admitting: Family Medicine

## 2024-08-14 ENCOUNTER — Encounter: Payer: Self-pay | Admitting: Family Medicine

## 2024-08-14 DIAGNOSIS — H9312 Tinnitus, left ear: Secondary | ICD-10-CM

## 2024-08-18 NOTE — Telephone Encounter (Signed)
 She saw Dr. Micheal ok to place referral to ENT

## 2024-08-18 NOTE — Addendum Note (Signed)
 Addended by: CHRISTYNE IDELL LABOR on: 08/18/2024 04:10 PM   Modules accepted: Orders

## 2024-09-16 NOTE — Progress Notes (Signed)
 Cardiology Office Note:  .   Date:  09/17/2024  ID:  Suzanne Marshall, DOB 1961-04-24, MRN [id number] PCP: Ozell Heron HERO, MD  [facility] Providers Cardiologist:  Shelda Bruckner, MD {  History of Present Illness: .   Suzanne Marshall is a 63 y.o. female  with a hx of arrhythmia with pregnancy, asthma, family history of heart disease who is seen for follow up today. I initially met her 10/24/21 as a new consult at the request of Ozell Heron HERO, MD for the evaluation and management of coronary artery calcification.   Cardiac history: Father with history of CABG, mother with history of MI. Had calcium  score 08/30/21, CAC 24, 80th percentile.   Today: Went to ER at the end of June, was having severe back pain, palpitations, epigastric tenderness, blood pressure was elevated. Workup unremarkable except for hiatal hernia. Started taking acid reducer with good results.  She has questions about ear issues, blood pressure, CPAP today. Ears have felt off since flying back from [place]  earlier this year, feels that her ears pop or chime periodically. CPAP was causing a lot of pressure on her ears and she had to stop. Does feel that things are slowly improving. Following with her PCP. Also has appointment with ENT tomorrow to evaluate further.   Has noted more PVCs, especially when she feels stress/anxious. Feels that these have improved since the summer. Worse with caffeine, she is avoiding. Sometimes feels lightheaded but unclear if this if due to ear issues above or PVCs.  ROS: Otherwise denies chest pain, shortness of breath at rest or with normal exertion. No PND, orthopnea, LE edema or unexpected weight gain. No syncope. ROS otherwise negative except as noted.   Studies Reviewed: SABRA    EKG:       Physical Exam:   VS:  BP 106/72   Pulse 64   Ht 5' 6 (1.676 m)   Wt 149 lb (67.6 kg)   LMP 12/06/2014   SpO2 97%   BMI 24.05 kg/m    Wt Readings from Last 3  Encounters:  09/17/24 149 lb (67.6 kg)  06/09/24 151 lb 14.4 oz (68.9 kg)  05/13/24 152 lb 11.2 oz (69.3 kg)    GEN: Well nourished, well developed in no acute distress HEENT: Normal, moist mucous membranes NECK: No JVD CARDIAC: regular rhythm with intermittent ectopy, normal S1 and S2, no rubs or gallops. No murmur. VASCULAR: Radial and DP pulses 2+ bilaterally. No carotid bruits RESPIRATORY:  Clear to auscultation without rales, wheezing or rhonchi  ABDOMEN: Soft, non-tender, non-distended MUSCULOSKELETAL:  Ambulates independently SKIN: Warm and dry, no edema NEUROLOGIC:  Alert and oriented x 3. No focal neuro deficits noted. PSYCHIATRIC:  Normal affect    ASSESSMENT AND PLAN: .    Coronary artery calcification on CT, consistent with nonobstructive CAD Family history of heart disease Hypercholesterolemia Statin intolerance -attempted both rosuvastatin  and pravastatin , did not tolerate due to tinnitus/back pain.  -we discussed ezetimibe, nexletol, and PCSK9i today. She is willing to consider PCSK9i if lipids are significantly abnormal or lpa elevated -currently taking 81 mg aspirin and tolerating well -reviewed red flag warning signs that need immediate medical attention -LDL goal <70 -most recent lipids 08/01/22: Tchol 215, HDL 49, LDL 146, TG 100 -lipids ordered today. We also discussed lp(a) today, she is amenable  PVCs -intermittent, triggered by stress/anxiety/caffeine -continue to monitor symptoms, if worsens would get Zio monitor  CV risk counseling and prevention -recommend heart healthy/Mediterranean diet, with whole grains,  fruits, vegetable, fish, lean meats, nuts, and olive oil. Limit salt. -recommend moderate walking, 3-5 times/week for 30-50 minutes each session. Aim for at least 150 minutes/week. Goal should be pace of 3 miles/hours, or walking 1.5 miles in 30 minutes -recommend avoidance of tobacco products. Avoid excess alcohol.  Dispo: 1 year or sooner as  needed  Signed, Shelda Bruckner, MD   Shelda Bruckner, MD, PhD, [facility]  [facility]  [facility] at [facility] [address] [place], [place] [address] [phone]

## 2024-09-17 ENCOUNTER — Encounter (HOSPITAL_BASED_OUTPATIENT_CLINIC_OR_DEPARTMENT_OTHER): Payer: Self-pay | Admitting: Cardiology

## 2024-09-17 ENCOUNTER — Ambulatory Visit (HOSPITAL_BASED_OUTPATIENT_CLINIC_OR_DEPARTMENT_OTHER): Admitting: Cardiology

## 2024-09-17 VITALS — BP 106/72 | HR 64 | Ht 66.0 in | Wt 149.0 lb

## 2024-09-17 DIAGNOSIS — E7800 Pure hypercholesterolemia, unspecified: Secondary | ICD-10-CM

## 2024-09-17 DIAGNOSIS — I2510 Atherosclerotic heart disease of native coronary artery without angina pectoris: Secondary | ICD-10-CM | POA: Diagnosis not present

## 2024-09-17 DIAGNOSIS — Z8249 Family history of ischemic heart disease and other diseases of the circulatory system: Secondary | ICD-10-CM

## 2024-09-17 DIAGNOSIS — Z789 Other specified health status: Secondary | ICD-10-CM | POA: Diagnosis not present

## 2024-09-17 DIAGNOSIS — Z7189 Other specified counseling: Secondary | ICD-10-CM

## 2024-09-17 DIAGNOSIS — I493 Ventricular premature depolarization: Secondary | ICD-10-CM

## 2024-09-17 NOTE — Patient Instructions (Signed)
 Medication Instructions:  No changes *If you need a refill on your cardiac medications before your next appointment, please call your pharmacy*  Lab Work: Return for fasting blood work (LIPIDS, LPa)  Testing/Procedures: none  Follow-Up: At [facility], you and your health needs are our priority.  As part of our continuing mission to provide you with exceptional heart care, our providers are all part of one team.  This team includes your primary Cardiologist (physician) and Advanced Practice Providers or APPs (Physician Assistants and Nurse Practitioners) who all work together to provide you with the care you need, when you need it.  Your next appointment:   12 month(s)  Provider:   Shelda Bruckner, MD, Rosaline Bane, NP, or Reche Finder, NP

## 2024-09-18 ENCOUNTER — Ambulatory Visit (INDEPENDENT_AMBULATORY_CARE_PROVIDER_SITE_OTHER): Admitting: Audiology

## 2024-09-18 ENCOUNTER — Ambulatory Visit (INDEPENDENT_AMBULATORY_CARE_PROVIDER_SITE_OTHER): Admitting: Physician Assistant

## 2024-09-18 ENCOUNTER — Encounter (INDEPENDENT_AMBULATORY_CARE_PROVIDER_SITE_OTHER): Payer: Self-pay | Admitting: Physician Assistant

## 2024-09-18 VITALS — BP 105/68 | HR 73 | Temp 98.0°F | Ht 66.0 in | Wt 140.0 lb

## 2024-09-18 DIAGNOSIS — Z0110 Encounter for examination of ears and hearing without abnormal findings: Secondary | ICD-10-CM | POA: Diagnosis not present

## 2024-09-18 DIAGNOSIS — H93292 Other abnormal auditory perceptions, left ear: Secondary | ICD-10-CM | POA: Diagnosis not present

## 2024-09-18 DIAGNOSIS — H9312 Tinnitus, left ear: Secondary | ICD-10-CM

## 2024-09-18 DIAGNOSIS — H6993 Unspecified Eustachian tube disorder, bilateral: Secondary | ICD-10-CM

## 2024-09-18 NOTE — Progress Notes (Signed)
 Dear Dr. Ozell, Here is my assessment for our mutual patient, Suzanne Marshall. Thank you for allowing me the opportunity to care for your patient. Please do not hesitate to contact me should you have any other questions. Sincerely, Chyrl Cohen PA-C  Otolaryngology Clinic Note Referring provider: Dr. Ozell HPI:  Suzanne Marshall is a 63 y.o. female kindly referred by Dr. Ozell   The patient is a 63 year old female seen in our office for evaluation of tinnitus.  The patient notes that in May 2025 she was in [place]  on vacation.  She had a upper respiratory infection.  She notes that flying home she felt fullness in the bilateral ears.  She followed up with her primary care who noted bilateral effusions.  She notes using decongestant medication, over the course of the last several months the symptoms seem to have improved.  She notes since that time she has had intermittent tonal chime in her left ear, usually once per day, does not last not associated with any other symptoms.  She denies any previous history recurrent ear infections.  She notes seasonal allergies and uses Nasacort and oral decongestants.  She denies any associated dizziness.     Independent Review of Additional Tests or Records:  None   PMH/Meds/All/SocHx/FamHx/ROS:   Past Medical History:  Diagnosis Date   Allergic rhinitis    Anxiety and depression 12/17/2014   Arrhythmia 9 yrs ago   with pregnancy   Asthma    Cystitides, interstitial, chronic    Depression    History of irregular menstrual bleeding 12/17/2014   S/p eval with gyn, perimenopausal    Hot flashes 12/17/2014   Insomnia 12/17/2014   Plantar fascial fibromatosis    Ulcerative colitis (HCC) 12/17/2014   Managed by Dr. Vicci dx at 63 yrs old     Past Surgical History:  Procedure Laterality Date   COLONOSCOPY WITH PROPOFOL  N/A 12/28/2014   Procedure: COLONOSCOPY WITH PROPOFOL ;  Surgeon: Gladis MARLA Vicci, MD;  Location: [facility];  Service:  Endoscopy;  Laterality: N/A;   colonscopy  last done 7 yrs ago   x 4   DILATION AND CURETTAGE OF UTERUS  12 yrs ago   RHINOPLASTY  yrs ago    Family History  Problem Relation Age of Onset   Heart disease Father 54       MI   Hypertension Father    Bladder Cancer Father    Heart disease Paternal Grandmother    Heart attack Paternal Grandfather 87   Colon cancer Neg Hx    Stomach cancer Neg Hx    Esophageal cancer Neg Hx    Pancreatic cancer Neg Hx      Social Connections: Not on file      Current Outpatient Medications:    acetaminophen (TYLENOL) 325 MG tablet, Take 650 mg by mouth every 6 (six) hours as needed for moderate pain or headache., Disp: , Rfl:    albuterol  (VENTOLIN  HFA) 108 (90 Base) MCG/ACT inhaler, INHALE 2 PUFFS INTO THE LUNGS EVERY 6 HOURS AS NEEDED FOR WHEEZING OR SHORTNESS OF BREATH, Disp: 8.5 g, Rfl: 1   cetirizine (ZYRTEC) 10 MG tablet, Take 5 mg by mouth at bedtime. (Patient taking differently: Take 5 mg by mouth at bedtime as needed for allergies or rhinitis.), Disp: , Rfl:    Cholecalciferol (VITAMIN D3 PO), Take 25 mcg by mouth daily., Disp: , Rfl:    Multiple Vitamin (MULTIVITAMIN WITH MINERALS) TABS tablet, Take 1 tablet by mouth daily., Disp: ,  Rfl:    triamcinolone (NASACORT) 55 MCG/ACT AERO nasal inhaler, Place 2 sprays into the nose as needed. , Disp: , Rfl:    zolpidem  (AMBIEN ) 5 MG tablet, Take 1 tablet (5 mg total) by mouth at bedtime., Disp: 30 tablet, Rfl: 0   NON FORMULARY, CPAP machine (Patient not taking: Reported on 09/18/2024), Disp: , Rfl:    Physical Exam:   BP 105/68 (BP Location: Right Arm, Patient Position: Sitting, Cuff Size: Normal)   Pulse 73   Temp 98 F (36.7 C) (Oral)   Ht 5' 6 (1.676 m)   Wt 140 lb (63.5 kg)   LMP 12/06/2014   SpO2 97%   BMI 22.60 kg/m   Pertinent Findings  CN II-XII intact Bilateral EAC clear and TM intact with well pneumatized middle ear spaces Anterior rhinoscopy: Septum left deviation;  bilateral inferior turbinates with no hypertrophy No lesions of oral cavity/oropharynx; dentition within normal limits No obviously palpable neck masses/lymphadenopathy/thyromegaly No respiratory distress or stridor  Seprately Identifiable Procedures:  None  Impression & Plans:  [name] is a 63 y.o. female with the following   Tonal tinnitus-  Intermittent tonal tinnitus, no alarming features.  I would like audiological evaluation for further assessment.  Once this is available I will call her with the results.  Middle ear effusion-  No effusion on exam today, will assess with audiological evaluation.  If symptoms return I like her to reach out to the office.  Likely secondary to patient tube dysfunction during the time of her upper respiratory infection.   - f/u phone call discussion after audiological evaluation   Thank you for allowing me the opportunity to care for your patient. Please do not hesitate to contact me should you have any other questions.  Sincerely, Chyrl Cohen PA-C [facility] Phone: [phone] Fax: [phone]  09/18/2024, 11:24 AM

## 2024-09-18 NOTE — Progress Notes (Signed)
  [address], [address] [place], [place] [address] [phone]  Audiological Evaluation    Name: Suzanne Marshall     DOB:   05/19/1961      MRN:   [id number]                                                                                     Service Date: 09/18/2024     Accompanied by: unaccompanied   Patient comes today after Reyes Cohen, PA-C sent a referral for a hearing evaluation due to concerns with tinnitus.   Symptoms Yes Details  Hearing loss  []    Tinnitus  [x]  Left side - a chime sometimes  Ear pain/ infections/pressure  [x]  Had some fluid in her ears and pain during  flight - reports it seems to have resolved  Balance problems  []    Noise exposure history  []    Previous ear surgeries  []    Family history of hearing loss  []    Amplification  []    Other  []      Otoscopy: Right ear: Clear external ear canal and notable landmarks visualized on the tympanic membrane. Left ear:  Clear external ear canal and notable landmarks visualized on the tympanic membrane.  Tympanometry: Right ear: Type A- Normal external ear canal volume with normal middle ear pressure and tympanic membrane compliance. Left ear: Type A- Normal external ear canal volume with normal middle ear pressure and tympanic membrane compliance.  Pure tone Audiometry: Both ears- Normal to borderline normal hearing from 250 Hz - 8000 Hz.  Speech Audiometry: Right ear- Speech Reception Threshold (SRT) was obtained at 10 dBHL. Left ear-Speech Reception Threshold (SRT) was obtained at 10 dBHL.   Word Recognition Score Tested using NU-6 (recorded) Right ear: 100% was obtained at a presentation level of 50 dBHL with contralateral masking which is deemed as  excellent. Left ear: 100% was obtained at a presentation level of 50 dBHL with contralateral masking which is deemed as  excellent.   The hearing test results were completed under headphones and results are deemed to be of good  reliability. Test technique:  conventional    Impression: There is not a significant difference in pure-tone thresholds between ears. There is not a significant difference in the word recognition score in between ears.    Recommendations: Follow up with ENT as scheduled for today. Return for a hearing evaluation if concerns with hearing changes arise or per MD recommendation.   Octavius Shin MARIE LEROUX-MARTINEZ, AUD

## 2024-09-22 LAB — LAB REPORT - SCANNED
EGFR (Non-African Amer.): 82
Free T4: 0.99
TSH: 2.08 (ref 0.41–5.90)

## 2024-09-29 ENCOUNTER — Other Ambulatory Visit: Payer: Self-pay | Admitting: Family Medicine

## 2024-09-29 DIAGNOSIS — G4700 Insomnia, unspecified: Secondary | ICD-10-CM

## 2024-11-06 ENCOUNTER — Encounter (HOSPITAL_BASED_OUTPATIENT_CLINIC_OR_DEPARTMENT_OTHER): Payer: Self-pay

## 2024-11-06 DIAGNOSIS — I493 Ventricular premature depolarization: Secondary | ICD-10-CM

## 2024-11-09 ENCOUNTER — Ambulatory Visit: Attending: Cardiology

## 2024-11-09 DIAGNOSIS — I493 Ventricular premature depolarization: Secondary | ICD-10-CM

## 2024-11-09 NOTE — Progress Notes (Unsigned)
 Enrolled patient for a 14 day Zio XT  monitor to be mailed to patients home

## 2024-11-09 NOTE — Telephone Encounter (Signed)
 From last ov note w Dr. Lonni 09/17/24.  PVCs -intermittent, triggered by stress/anxiety/caffeine -continue to monitor symptoms, if worsens would get Zio monitor   Order for zio xt placed per Dr. Lonni.  Pt aware via mychart reply.

## 2024-11-11 ENCOUNTER — Encounter: Payer: Self-pay | Admitting: Family Medicine

## 2024-11-11 ENCOUNTER — Telehealth: Admitting: Family Medicine

## 2024-11-11 VITALS — BP 109/70 | Wt 142.0 lb

## 2024-11-11 DIAGNOSIS — G4733 Obstructive sleep apnea (adult) (pediatric): Secondary | ICD-10-CM

## 2024-11-11 DIAGNOSIS — F419 Anxiety disorder, unspecified: Secondary | ICD-10-CM | POA: Diagnosis not present

## 2024-11-11 DIAGNOSIS — F32A Depression, unspecified: Secondary | ICD-10-CM

## 2024-11-11 DIAGNOSIS — K219 Gastro-esophageal reflux disease without esophagitis: Secondary | ICD-10-CM | POA: Diagnosis not present

## 2024-11-11 MED ORDER — HYDROXYZINE PAMOATE 25 MG PO CAPS: 25.0000 mg | ORAL_CAPSULE | Freq: Three times a day (TID) | ORAL | 0 refills | Status: AC | PRN

## 2024-11-11 MED ORDER — ESCITALOPRAM OXALATE 10 MG PO TABS: 10.0000 mg | ORAL_TABLET | Freq: Every day | ORAL | 0 refills | Status: AC

## 2024-11-11 NOTE — Progress Notes (Signed)
 Virtual Medical Office Visit  Patient:  Suzanne Marshall      Age: 63 y.o.       Sex:  female  Date:   11/11/2024  PCP:    Ozell Heron HERO, MD   Today's Healthcare Provider: Heron HERO Ozell, MD    Assessment/Plan:   Summary assessment:  Sham was seen today for anxiety.  Anxiety and depression -     Escitalopram  Oxalate; Take 1 tablet (10 mg total) by mouth daily.  Dispense: 90 tablet; Refill: 0 -     hydrOXYzine Pamoate; Take 1 capsule (25 mg total) by mouth every 8 (eight) hours as needed for anxiety.  Dispense: 30 capsule; Refill: 0   Assessment and Plan    Anxiety disorder Increasing anxiety with palpitations, potentially exacerbated by lack of sleep and elevated cortisol levels. Lexapro  is effective and familiar. Hydroxyzine is considered for acute anxiety attacks. Propranolol is a potential future option post-cardiac evaluation. - Prescribed Lexapro  for anxiety management. - Prescribed hydroxyzine for acute anxiety attacks. - Will consider propranolol post-cardiac evaluation.  Obstructive sleep apnea Mild to moderate obstructive sleep apnea with an AHI of 20. Discussed alternative treatments due to CPAP intolerance, including the Inspire device and oral appliances. ENT consultation is recommended for further evaluation and management. - Consult ENT for evaluation of Inspire device and oral appliance options.  Gastroesophageal reflux disease GERD managed with famotidine. Discussed low risk of palpitations associated with famotidine (<1%). Emphasized evidence-based treatment over herbal remedies. - Continue famotidine for GERD management.  General health maintenance Discussed the importance of regular physical exams and lab work, including cholesterol and blood work. Plan to coordinate with [facility] for lab results. - Will schedule physical exam in April. - Requested lab results from [facility] for review.        No  follow-ups on file.   She was advised to call the office or go to ER if her condition worsens    Subjective:   Suzanne Marshall is a 63 y.o. female with PMH significant for: Past Medical History:  Diagnosis Date   Allergic rhinitis    Anxiety and depression 12/17/2014   Arrhythmia 9 yrs ago   with pregnancy   Asthma    Cystitides, interstitial, chronic    Depression    History of irregular menstrual bleeding 12/17/2014   S/p eval with gyn, perimenopausal    Hot flashes 12/17/2014   Insomnia 12/17/2014   Plantar fascial fibromatosis    Ulcerative colitis (HCC) 12/17/2014   Managed by Dr. Vicci dx at 63 yrs old     Presenting today with: Chief Complaint  Patient presents with   Anxiety    Patient complains of increased anxiety for the past 5 months since she discontinued Lexapro  as she felt she did not need the medication, caring also for her mother     She clarifies and reports that her condition: Discussed the use of AI scribe software for clinical note transcription with the patient, who gave verbal consent to proceed.  History of Present Illness   Suzanne Marshall is a 63 year old female with anxiety and sleep apnea who presents with increasing anxiety and palpitations.  She reports worsening anxiety with palpitations, often triggered by worry. Lexapro  was effective for anxiety in the past. Hydroxyzine has helped with anxiety, panic attacks, and sleep.  She has sleep apnea with an AHI around 20 and poor CPAP tolerance. Ongoing sleep disturbance is worsening her anxiety and  palpitations.  She takes famotidine for acid reflux and notes palpitations are listed as a rare side effect, though she had palpitations before starting it. She also has asthma-like symptoms and a chronic cough related to reflux.  She has used Ambien  in the past for sleep.       She denies having any: Chest pain, dizziness         Objective/Observations  Physical Exam:  Polite and  friendly Gen: NAD, resting comfortably Pulm: Normal work of breathing Neuro: Grossly normal, moves all extremities Psych: Normal affect and thought content Problem specific physical exam findings:    No images are attached to the encounter or orders placed in the encounter.    Results: No results found for any visits on 11/11/24.   No results found for this or any previous visit (from the past 2160 hours).         Virtual Visit via Video   I connected with Suzanne Marshall on 11/11/24 at 11:20 AM EST by a video enabled telemedicine application and verified that I am speaking with the correct person using two identifiers. The limitations of evaluation and management by telemedicine and the availability of in person appointments were discussed. The patient expressed understanding and agreed to proceed.   Percentage of appointment time on video:  100% Patient location: Home Provider location: [place] [facility] Persons participating in the virtual visit: Myself and Patient

## 2024-12-19 ENCOUNTER — Encounter: Payer: Self-pay | Admitting: Gastroenterology

## 2024-12-23 ENCOUNTER — Other Ambulatory Visit: Payer: Self-pay | Admitting: Family Medicine

## 2024-12-23 DIAGNOSIS — G4700 Insomnia, unspecified: Secondary | ICD-10-CM

## 2024-12-28 ENCOUNTER — Ambulatory Visit (HOSPITAL_BASED_OUTPATIENT_CLINIC_OR_DEPARTMENT_OTHER): Payer: Self-pay | Admitting: Cardiology

## 2024-12-28 DIAGNOSIS — I493 Ventricular premature depolarization: Secondary | ICD-10-CM

## 2024-12-28 DIAGNOSIS — I471 Supraventricular tachycardia, unspecified: Secondary | ICD-10-CM

## 2025-01-21 ENCOUNTER — Other Ambulatory Visit (HOSPITAL_BASED_OUTPATIENT_CLINIC_OR_DEPARTMENT_OTHER)

## 2025-02-08 ENCOUNTER — Ambulatory Visit (HOSPITAL_BASED_OUTPATIENT_CLINIC_OR_DEPARTMENT_OTHER): Admitting: Cardiology
